# Patient Record
Sex: Male | Born: 1985 | Race: White | Hispanic: No | Marital: Married | Smoking: Never smoker
Health system: Southern US, Community
[De-identification: ages and names within clinical notes are randomized; demographics above are authoritative.]

## PROBLEM LIST (undated history)

## (undated) DIAGNOSIS — M1990 Unspecified osteoarthritis, unspecified site: Secondary | ICD-10-CM

## (undated) DIAGNOSIS — T07XXXA Unspecified multiple injuries, initial encounter: Secondary | ICD-10-CM

## (undated) HISTORY — PX: TUBAL LIGATION: SHX77

## (undated) HISTORY — PX: ANKLE FRACTURE SURGERY: SHX122

## (undated) HISTORY — DX: Unspecified osteoarthritis, unspecified site: M19.90

## (undated) HISTORY — PX: FRACTURE SURGERY: SHX138

## (undated) HISTORY — DX: Unspecified multiple injuries, initial encounter: T07.XXXA

## (undated) HISTORY — PX: HERNIA REPAIR: SHX51

---
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
## 2009-10-14 ENCOUNTER — Ambulatory Visit (HOSPITAL_COMMUNITY): Admission: RE | Admit: 2009-10-14 | Discharge: 2009-10-14 | Payer: Self-pay | Admitting: Family Medicine

## 2010-07-29 ENCOUNTER — Other Ambulatory Visit: Payer: Self-pay | Admitting: Orthopedic Surgery

## 2010-07-29 ENCOUNTER — Ambulatory Visit (HOSPITAL_COMMUNITY)
Admission: RE | Admit: 2010-07-29 | Discharge: 2010-07-29 | Disposition: A | Discharge status: Home / Self Care | Payer: 59 | Source: Ambulatory Visit | Attending: Orthopedic Surgery | Admitting: Orthopedic Surgery

## 2010-07-29 ENCOUNTER — Encounter: Payer: Self-pay | Admitting: Orthopedic Surgery

## 2010-07-29 DIAGNOSIS — S92109A Unspecified fracture of unspecified talus, initial encounter for closed fracture: Secondary | ICD-10-CM | POA: Insufficient documentation

## 2010-07-29 DIAGNOSIS — M25473 Effusion, unspecified ankle: Secondary | ICD-10-CM | POA: Insufficient documentation

## 2010-07-29 DIAGNOSIS — R52 Pain, unspecified: Secondary | ICD-10-CM

## 2010-07-29 DIAGNOSIS — R609 Edema, unspecified: Secondary | ICD-10-CM

## 2010-07-29 DIAGNOSIS — X58XXXA Exposure to other specified factors, initial encounter: Secondary | ICD-10-CM | POA: Insufficient documentation

## 2010-07-29 DIAGNOSIS — M25476 Effusion, unspecified foot: Secondary | ICD-10-CM | POA: Insufficient documentation

## 2010-07-29 DIAGNOSIS — M25579 Pain in unspecified ankle and joints of unspecified foot: Secondary | ICD-10-CM | POA: Insufficient documentation

## 2010-07-30 ENCOUNTER — Ambulatory Visit (INDEPENDENT_AMBULATORY_CARE_PROVIDER_SITE_OTHER): Payer: 59 | Admitting: Orthopedic Surgery

## 2010-07-30 ENCOUNTER — Encounter: Payer: Self-pay | Admitting: Orthopedic Surgery

## 2010-07-30 DIAGNOSIS — S96819A Strain of other specified muscles and tendons at ankle and foot level, unspecified foot, initial encounter: Secondary | ICD-10-CM | POA: Insufficient documentation

## 2010-07-30 DIAGNOSIS — S93429A Sprain of deltoid ligament of unspecified ankle, initial encounter: Secondary | ICD-10-CM | POA: Insufficient documentation

## 2010-07-30 DIAGNOSIS — S93419A Sprain of calcaneofibular ligament of unspecified ankle, initial encounter: Secondary | ICD-10-CM

## 2010-07-30 DIAGNOSIS — S93499A Sprain of other ligament of unspecified ankle, initial encounter: Secondary | ICD-10-CM

## 2010-08-05 NOTE — Letter (Signed)
Summary: Out of School Circuit + weight tr'g classes  [facility]  [address]  [place], [place] [address]   Phone: [phone]  Fax: [phone]    July 30, 2010   Student:  Paul Watson [employer]    To Whom It May Concern:   For Medical reasons, please excuse the above named student from school for the following dates:  Start:   July 30, 2010  End/Return to Education administrator / weight training - Nov 12, 2010 or until otherwise notified   If you need additional information, please feel free to contact our office.   Sincerely,    Terrance Mass, MD    ****This is a legal document and cannot be tampered with.  Schools are authorized to verify all information and to do so accordingly.

## 2010-08-05 NOTE — Letter (Signed)
Summary: Out of School/classes  [facility]  [address]  [place], [place] [address]   Phone: [phone]  Fax: [phone]    July 30, 2010   Student:  Burnis Kingfisher [employer]    To Whom It May Concern:   For Medical reasons, please excuse the above named student from school for the following dates:  Start:   July 30, 2010  End/Return to classes:  August 03, 2010 or until otherwise notified      If you need additional information, please feel free to contact our office.   Sincerely,    Terrance Mass, MD    ****This is a legal document and cannot be tampered with.  Schools are authorized to verify all information and to do so accordingly.

## 2010-08-05 NOTE — Assessment & Plan Note (Signed)
Summary: EVAL/XRAY [facility] 07/28/10 PER DR Ernan Runkles/[facility]/CAF   Vital Signs:  Patient profile:   25 year old male Height:      72 inches Weight:      183 pounds Pulse rate:   82 / minute Resp:     18 per minute  Visit Type:  new patient Referring Provider:  ap er Primary Provider:  Dr. Lubertha South  CC:  right ankle pain.  History of Present Illness: I saw Paul Watson in the office today for an initial visit.  He is a 24 years old man with the complaint of:  right ankle pain.  DOI 07/29/10.  Xrays [facility] 07/29/10.  No meds.  This 25 year old male had multiple ankle injuries and multiple angles brings her no treatment other than limited time of immobilization, ice and elevation. No therapy.  On yesterday. He was playing basketball turned his ankle again. No complaints of sharp throbbing, burning, pain, which is 7-8/10. The pain is constant and associated with throbbing. He is a large amount of bruising over the lateral and anterolateral ankle joint with pain in the syndesmosis region, medial malleolar and lateral malleolar areas. An x-ray as already been done, and seeing if he has a medial malleolar nondisplaced fracture at the tip associated with avulsion. There is a minimally displaced lateral talar osteochondral defect, and he has a lateral malleolar avulsion question old versus new    Allergies (verified): No Known Drug Allergies  Past History:  Past Medical History: na  Past Surgical History: hernia  Family History: Family History of Arthritis  Social History: college no smoking no alcohol 2 sodas per day  Review of Systems Constitutional:  Denies weight loss, weight gain, fever, chills, and fatigue. Cardiovascular:  Denies chest pain, palpitations, fainting, and murmurs. Respiratory:  Denies short of breath, wheezing, couch, tightness, pain on inspiration, and snoring . Gastrointestinal:  Denies heartburn, nausea, vomiting, diarrhea, constipation, and blood in your  stools. Genitourinary:  Denies frequency, urgency, difficulty urinating, painful urination, flank pain, and bleeding in urine. Neurologic:  Denies numbness, tingling, unsteady gait, dizziness, tremors, and seizure. Musculoskeletal:  Denies joint pain, swelling, instability, stiffness, redness, heat, and muscle pain. Endocrine:  Denies excessive thirst, exessive urination, and heat or cold intolerance. Psychiatric:  Denies nervousness, depression, anxiety, and hallucinations. Skin:  Denies changes in the skin, poor healing, rash, itching, and redness. HEENT:  Denies blurred or double vision, eye pain, redness, and watering. Immunology:  Denies seasonal allergies, sinus problems, and allergic to bee stings. Hemoatologic:  Denies easy bleeding and brusing.  Physical Exam  Skin:  RIGHT ankle/intact without lesions or rashes Psych:  alert and cooperative; normal mood and affect; normal attention span and concentration   Foot/Ankle Exam  General:    Well-developed, well-nourished ,normal body habitus; no deformities, normal grooming.    Gait:    crutches nwb right ankle   Skin:    ecchymosis over the syndesmosois   Inspection:    swelling: right ankle, medial , severe lateral, severe anterior   Palpation:    tenderness R-lateral malleoulus: and tenderness R-medial malleoulus and tib fib joint   Vascular:    dorsalis pedis and posterior tibial pulses 2+ and symmetric, capillary refill < 2 seconds, normal hair pattern, no evidence of ischemia.   Sensory:    gross sensation intact bilaterally in lower extremities.    Motor:    dorsiflexion weakness   Reflexes:    .deferred    Ankle Exam:    Right:  Inspection:  Abnormal    Palpation:  Abnormal    Stability:  ?    Tenderness:  yes    Swelling:  yes    Range of Motion:       Dorsiflex-Passive: 0       Plantar Flex-Passive: 15   Impression & Recommendations:  Problem # 1:  SPRAIN AND STRAIN OF CALCANEOFIBULAR  (ICD-845.02) Assessment New  Orders: New Patient Level III (29562)  Problem # 2:  SPRAIN AND STRAIN OF DELTOID OF ANKLE (ICD-845.01) Assessment: New  Orders: New Patient Level III (13086)  Problem # 3:  OTHER ANKLE SPRAIN AND STRAIN (ICD-845.09) Assessment: New  The x-rays were done at [facility]. The report and the films have been reviewed. medial and lateral avulsion tip fractures and talus - lateral dome fracture - small   nonweightbearing for 6 weeks.  Plan PT for 4-6 weeks, then MRI if continued trouble such as catching or locking  Orders: New Patient Level III (57846)  Medications Added to Medication List This Visit: 1)  Ibuprofen 800 Mg Tabs (Ibuprofen) .Marland Kitchen.. 1 by mouth three times a day  Patient Instructions: 1)  work note  2)  school note  3)  no weight x 6 weeks 4)  exercise the ankle evrey night 25 repititions  5)  f/u 3 weeks  6)  take ibuprofen for pain 800 mg three times a day  7)  apply ice every night for 30 minutes x 72 hrs  Prescriptions: IBUPROFEN 800 MG TABS (IBUPROFEN) 1 by mouth three times a day  #90 x 2   Entered and Authorized by:   Fuller Canada MD   Signed by:   Fuller Canada MD on 07/30/2010   Method used:   Faxed to ...       [facility]  [address] [facility] (retail)       [address]       [place], [place]  [address]       Ph: [phone]       Fax: [phone]   RxID:   [id number]    Orders Added: 1)  New Patient Level III [phone]

## 2010-08-05 NOTE — Letter (Signed)
Summary: Out of Work  [facility]  [address]  [place], [place] [address]   Phone: [phone]  Fax: [phone]    July 30, 2010   Employee:  Paul Watson [employer] [facility] [facility]    To Whom It May Concern:   For Medical reasons, please excuse the above named employee from work for the following dates:  Start:   07/30/10  Continue out of work status X6 weeks or until further notice   End (estimated)  09/12/10  If you need additional information, please feel free to contact our office.         Sincerely,    Terrance Mass, MD

## 2010-08-12 ENCOUNTER — Encounter: Payer: Self-pay | Admitting: Orthopedic Surgery

## 2010-08-25 ENCOUNTER — Encounter: Payer: Self-pay | Admitting: Orthopedic Surgery

## 2010-08-25 ENCOUNTER — Ambulatory Visit (INDEPENDENT_AMBULATORY_CARE_PROVIDER_SITE_OTHER): Payer: 59 | Admitting: Orthopedic Surgery

## 2010-08-25 DIAGNOSIS — S92109A Unspecified fracture of unspecified talus, initial encounter for closed fracture: Secondary | ICD-10-CM

## 2010-08-25 DIAGNOSIS — S93419A Sprain of calcaneofibular ligament of unspecified ankle, initial encounter: Secondary | ICD-10-CM

## 2010-08-25 DIAGNOSIS — S93429A Sprain of deltoid ligament of unspecified ankle, initial encounter: Secondary | ICD-10-CM

## 2010-09-01 NOTE — Assessment & Plan Note (Signed)
Summary: 3 WK RE-CK RT FOOT/XRAYS/FX CARE/[facility]/CAF   Visit Type:  Follow-up Referring Provider:  ap er Primary Provider:  Dr. Lubertha South  CC:  right ankle pain.  History of Present Illness: 25 year old male with multiple injuries to his RIGHT ankle presents after 3 weeks treatment and a Cam Walker nonweightbearing for an osteochondral fracture the Taylor's and recurrent ankle sprain DOI 07/29/10.  Xrays [facility] 07/29/10.  Medications: Ibuprofen 800 mg.  Complaints: none.  His pain has gone down his pain is less he has continued to be nonweightbearing      Allergies: No Known Drug Allergies  Review of Systems Musculoskeletal:  Complains of stiffness.  Physical Exam  Additional Exam:  He in with his crutches  He was nonweightbearing  He had good perfusion to his foot is cold or of the foot looked good the skin was intact sensation was normal   The drawer test was stable, the inversion test was stable, the version test was stable  The plantarflexion was approximately 15 and dorsiflexion 10       Impression & Recommendations:  Problem # 1:  SPRAIN AND STRAIN OF DELTOID OF ANKLE (ICD-845.01)  Orders: Est. Patient Level III (04540)  Problem # 2:  SPRAIN AND STRAIN OF CALCANEOFIBULAR (ICD-845.02)  Orders: Est. Patient Level III (98119)  Problem # 3:  CLOSED FRACTURE OF ASTRAGALUS (ICD-825.21) Assessment: Comment Only  Orders: Est. Patient Level III (14782)  Patient Instructions: 1)  xrays right ankle week of the 28th  2)  continue non weight bearing    Orders Added: 1)  Est. Patient Level III [95621]

## 2010-09-10 ENCOUNTER — Ambulatory Visit (INDEPENDENT_AMBULATORY_CARE_PROVIDER_SITE_OTHER): Payer: 59 | Admitting: Orthopedic Surgery

## 2010-09-10 ENCOUNTER — Encounter: Payer: Self-pay | Admitting: Orthopedic Surgery

## 2010-09-10 DIAGNOSIS — S93419A Sprain of calcaneofibular ligament of unspecified ankle, initial encounter: Secondary | ICD-10-CM

## 2010-09-10 DIAGNOSIS — S92109A Unspecified fracture of unspecified talus, initial encounter for closed fracture: Secondary | ICD-10-CM

## 2010-09-10 DIAGNOSIS — S93429A Sprain of deltoid ligament of unspecified ankle, initial encounter: Secondary | ICD-10-CM

## 2010-09-10 NOTE — Letter (Signed)
Summary: History form  History form   Imported By: Cammie Sickle 08/30/2010 13:49:34  _____________________________________________________________________  External Attachment:    Type:   Image     Comment:   External Document

## 2010-09-10 NOTE — Discharge Summary (Signed)
Separate identifiable. X-ray report 3 views, RIGHT ankle.  Reason for x-ray followup talar dome fracture.  Fracture fragment is nondisplaced, compared to previous x-rays. No changes have been noted in the position.  Impression nondisplaced posterior lateral talar dome fracture.

## 2010-09-10 NOTE — Patient Instructions (Signed)
WEIGHT BEAR AS TOLERATED   WEAR CAM WALKER X 2 WEEKS THEN WEAR ASO BRACE   START PT

## 2010-09-10 NOTE — Progress Notes (Signed)
Bob had a posterolateral talar dome lesion, and a acute on chronic ankle sprain. Presents back for x-ray. X-ray today shows that the fracture fragment remains nondisplaced.  His range of motion has improved. He is less tender less swollen.  Recommend physical therapy, ASO brace and. Follow up 6 weeks.

## 2010-09-15 ENCOUNTER — Ambulatory Visit (HOSPITAL_COMMUNITY)
Admission: RE | Admit: 2010-09-15 | Discharge: 2010-09-15 | Disposition: A | Discharge status: Home / Self Care | Payer: 59 | Source: Ambulatory Visit | Attending: Orthopedic Surgery | Admitting: Orthopedic Surgery

## 2010-09-15 DIAGNOSIS — M6281 Muscle weakness (generalized): Secondary | ICD-10-CM | POA: Insufficient documentation

## 2010-09-15 DIAGNOSIS — IMO0001 Reserved for inherently not codable concepts without codable children: Secondary | ICD-10-CM | POA: Insufficient documentation

## 2010-09-15 DIAGNOSIS — R262 Difficulty in walking, not elsewhere classified: Secondary | ICD-10-CM | POA: Insufficient documentation

## 2010-09-15 DIAGNOSIS — M25579 Pain in unspecified ankle and joints of unspecified foot: Secondary | ICD-10-CM | POA: Insufficient documentation

## 2010-09-15 DIAGNOSIS — M25673 Stiffness of unspecified ankle, not elsewhere classified: Secondary | ICD-10-CM | POA: Insufficient documentation

## 2010-09-15 DIAGNOSIS — M25676 Stiffness of unspecified foot, not elsewhere classified: Secondary | ICD-10-CM | POA: Insufficient documentation

## 2010-09-16 ENCOUNTER — Ambulatory Visit (HOSPITAL_COMMUNITY)
Admission: RE | Admit: 2010-09-16 | Discharge: 2010-09-16 | Disposition: A | Discharge status: Home / Self Care | Payer: 59 | Source: Ambulatory Visit | Admitting: Physical Therapy

## 2010-09-22 ENCOUNTER — Ambulatory Visit (HOSPITAL_COMMUNITY)
Admission: RE | Admit: 2010-09-22 | Discharge: 2010-09-22 | Disposition: A | Discharge status: Home / Self Care | Payer: 59 | Source: Ambulatory Visit | Attending: *Deleted | Admitting: *Deleted

## 2010-09-24 ENCOUNTER — Ambulatory Visit (HOSPITAL_COMMUNITY)
Admission: RE | Admit: 2010-09-24 | Discharge: 2010-09-24 | Disposition: A | Discharge status: Home / Self Care | Payer: 59 | Source: Ambulatory Visit | Attending: Family Medicine | Admitting: Family Medicine

## 2010-09-25 ENCOUNTER — Ambulatory Visit (HOSPITAL_COMMUNITY)
Admission: RE | Admit: 2010-09-25 | Discharge: 2010-09-25 | Disposition: A | Discharge status: Home / Self Care | Payer: 59 | Source: Ambulatory Visit

## 2010-09-29 ENCOUNTER — Ambulatory Visit (HOSPITAL_COMMUNITY)
Admission: RE | Admit: 2010-09-29 | Discharge: 2010-09-29 | Disposition: A | Discharge status: Home / Self Care | Payer: 59 | Source: Ambulatory Visit | Attending: Family Medicine | Admitting: Family Medicine

## 2010-10-01 ENCOUNTER — Ambulatory Visit (HOSPITAL_COMMUNITY)
Admission: RE | Admit: 2010-10-01 | Discharge: 2010-10-01 | Disposition: A | Discharge status: Home / Self Care | Payer: 59 | Source: Ambulatory Visit | Attending: Family Medicine | Admitting: Family Medicine

## 2010-10-06 ENCOUNTER — Ambulatory Visit (HOSPITAL_COMMUNITY)
Admission: RE | Admit: 2010-10-06 | Discharge: 2010-10-06 | Disposition: A | Discharge status: Home / Self Care | Payer: 59 | Source: Ambulatory Visit | Attending: Orthopedic Surgery | Admitting: Orthopedic Surgery

## 2010-10-06 DIAGNOSIS — M25579 Pain in unspecified ankle and joints of unspecified foot: Secondary | ICD-10-CM | POA: Insufficient documentation

## 2010-10-06 DIAGNOSIS — R262 Difficulty in walking, not elsewhere classified: Secondary | ICD-10-CM | POA: Insufficient documentation

## 2010-10-06 DIAGNOSIS — M6281 Muscle weakness (generalized): Secondary | ICD-10-CM | POA: Insufficient documentation

## 2010-10-06 DIAGNOSIS — IMO0001 Reserved for inherently not codable concepts without codable children: Secondary | ICD-10-CM | POA: Insufficient documentation

## 2010-10-08 ENCOUNTER — Ambulatory Visit (HOSPITAL_COMMUNITY): Payer: 59 | Admitting: Physical Therapy

## 2010-10-13 ENCOUNTER — Ambulatory Visit (HOSPITAL_COMMUNITY)
Admission: RE | Admit: 2010-10-13 | Discharge: 2010-10-13 | Disposition: A | Discharge status: Home / Self Care | Payer: 59 | Source: Ambulatory Visit | Attending: Orthopedic Surgery | Admitting: Orthopedic Surgery

## 2010-10-13 DIAGNOSIS — R262 Difficulty in walking, not elsewhere classified: Secondary | ICD-10-CM | POA: Insufficient documentation

## 2010-10-13 DIAGNOSIS — M25579 Pain in unspecified ankle and joints of unspecified foot: Secondary | ICD-10-CM | POA: Insufficient documentation

## 2010-10-13 DIAGNOSIS — M6281 Muscle weakness (generalized): Secondary | ICD-10-CM | POA: Insufficient documentation

## 2010-10-13 DIAGNOSIS — IMO0001 Reserved for inherently not codable concepts without codable children: Secondary | ICD-10-CM | POA: Insufficient documentation

## 2010-10-15 ENCOUNTER — Ambulatory Visit (HOSPITAL_COMMUNITY)
Admission: RE | Admit: 2010-10-15 | Discharge: 2010-10-15 | Disposition: A | Discharge status: Home / Self Care | Payer: 59 | Source: Ambulatory Visit | Attending: Family Medicine | Admitting: Family Medicine

## 2010-10-20 ENCOUNTER — Ambulatory Visit (HOSPITAL_COMMUNITY): Payer: 59

## 2010-10-22 ENCOUNTER — Ambulatory Visit (INDEPENDENT_AMBULATORY_CARE_PROVIDER_SITE_OTHER): Payer: 59 | Admitting: Orthopedic Surgery

## 2010-10-22 ENCOUNTER — Encounter: Payer: Self-pay | Admitting: Orthopedic Surgery

## 2010-10-22 DIAGNOSIS — M24876 Other specific joint derangements of unspecified foot, not elsewhere classified: Secondary | ICD-10-CM

## 2010-10-22 DIAGNOSIS — M25373 Other instability, unspecified ankle: Secondary | ICD-10-CM | POA: Insufficient documentation

## 2010-10-22 NOTE — Progress Notes (Signed)
Paul Watson is doing well. He is completed. Most of this therapy, visits he's met almost all of his goals. He's not taking any pain medicine anymore. He is walking without a limp. His swelling is gone down. His ankle is stable. He has good eversion power  He is released to normal activities. He should wear his brace when playing sports. He should complete his physical therapy visits. His followup is as needed

## 2010-10-22 NOTE — Patient Instructions (Signed)
Brace for sports   Ok to walk without brace   Finish PT visits

## 2010-10-23 ENCOUNTER — Ambulatory Visit (HOSPITAL_COMMUNITY)
Admission: RE | Admit: 2010-10-23 | Discharge: 2010-10-23 | Disposition: A | Discharge status: Home / Self Care | Payer: 59 | Source: Ambulatory Visit | Attending: Family Medicine | Admitting: Family Medicine

## 2010-10-26 ENCOUNTER — Telehealth: Payer: Self-pay | Admitting: *Deleted

## 2010-10-26 ENCOUNTER — Ambulatory Visit (HOSPITAL_COMMUNITY): Payer: 59 | Admitting: *Deleted

## 2010-10-26 NOTE — Telephone Encounter (Signed)
Is finishing last PT visit, wanted to let us know

## 2010-10-28 ENCOUNTER — Ambulatory Visit (HOSPITAL_COMMUNITY)
Admission: RE | Admit: 2010-10-28 | Discharge: 2010-10-28 | Disposition: A | Discharge status: Home / Self Care | Payer: 59 | Source: Ambulatory Visit | Attending: Family Medicine | Admitting: Family Medicine

## 2011-01-07 ENCOUNTER — Telehealth: Payer: Self-pay | Admitting: *Deleted

## 2011-01-07 NOTE — Telephone Encounter (Signed)
Called stating his ankle is hurting again since playing softball, please put this patient in for Tuesday thanks per Dr. Rexene Edison

## 2011-01-07 NOTE — Telephone Encounter (Signed)
Scheduled per note for Tuesday 01/12/11.

## 2011-01-12 ENCOUNTER — Ambulatory Visit (INDEPENDENT_AMBULATORY_CARE_PROVIDER_SITE_OTHER): Payer: 59 | Admitting: Orthopedic Surgery

## 2011-01-12 ENCOUNTER — Other Ambulatory Visit: Payer: Self-pay

## 2011-01-12 DIAGNOSIS — M25579 Pain in unspecified ankle and joints of unspecified foot: Secondary | ICD-10-CM

## 2011-01-12 DIAGNOSIS — M25571 Pain in right ankle and joints of right foot: Secondary | ICD-10-CM

## 2011-01-12 MED ORDER — METHYLPREDNISOLONE ACETATE 40 MG/ML IJ SUSP: 40.0000 mg | Freq: Once | INTRAMUSCULAR | Status: DC

## 2011-01-12 NOTE — Progress Notes (Signed)
Previous RIGHT ankle injury one of several chronic recurrent instability episodes and injuries to the RIGHT ankle.  When we worked him up he had an MRI and an x-ray which shows a osteochondral fragment in the superolateral dome of the tibialis.  We treated him with a cast/brace and then he went to therapy and he is in an ASO brace now.  He went back to play baseball/softball in his ankles or swelling he started having lateral ankle pain  Initial echo he had a high ankle sprain along with his chronic lower ankle sprains.  He has lateral gutter pain and pain Along the lateral border of the foot.  No weakness of the peroneals no tenderness of the peroneals.  Ankle remains stable.  He has some tenderness in the sinus tarsi.  Neurovascular exam is normal and ambulation is normal.  I injected the lateral gutter.  Repeat x-rays show a stable lateral talar dome OCD Ankle Injection Procedure Note  Pre-operative Diagnosis: right lateral ankle pain   Post-operative Diagnosis: same  Indications: ankle pain lateral gutter   Anesthesia: ethyl chloride   Procedure Details   Verbal consent was obtained for the procedure. The point of maximum tenderness was identified and marked.  After a sterile alcohol prep, the site was anesthetized.  A 25 gauge needle was advanced into the area without difficulty.  The area was then injected with 1 ml of depomedrol. The area was cleansed with isopropyl alcohol and a dressing was applied.   Complications:  None; patient tolerated the procedure well.

## 2011-01-12 NOTE — Patient Instructions (Signed)
You have received a steroid shot. 15% of patients experience increased pain at the injection site with in the next 24 hours. This is best treated with ice and tylenol extra strength 2 tabs every 8 hours. If you are still having pain please call the office.   Take ibuprofen 3 times daily for 2 weeks   Ice after sports

## 2011-02-02 ENCOUNTER — Ambulatory Visit (INDEPENDENT_AMBULATORY_CARE_PROVIDER_SITE_OTHER): Payer: 59 | Admitting: Orthopedic Surgery

## 2011-02-02 ENCOUNTER — Encounter: Payer: Self-pay | Admitting: Orthopedic Surgery

## 2011-02-02 DIAGNOSIS — S93419A Sprain of calcaneofibular ligament of unspecified ankle, initial encounter: Secondary | ICD-10-CM

## 2011-02-02 DIAGNOSIS — S92109A Unspecified fracture of unspecified talus, initial encounter for closed fracture: Secondary | ICD-10-CM

## 2011-02-02 NOTE — Patient Instructions (Signed)
Referral will be done for DR [facility] POSSIBLE ANKLE SCOPE

## 2011-02-02 NOTE — Progress Notes (Signed)
Previous RIGHT ankle injury one of several chronic recurrent instability episodes and injuries to the RIGHT ankle. When we worked him up he had an MRI and an x-ray which shows a osteochondral fragment in the superolateral dome of the talus. We treated him with a cast/brace and then he went to therapy and he is in an ASO brace now. He went back to play softball  his ankles started  Swelling; he started having lateral ankle pain  I injected the lateral gutter, and the swelling went down, but the pain has persisted.  Complains of lateral gutter, ankle pain. His drawer test seems good. He has no pain with inversion. However, he has tenderness in the lateral gutter and also in the sub-Taylor's recess.  May need ankle scope to clean up the lateral gutter.  Recommend consult for ankle scope with Dr Lestine Box.

## 2011-02-03 ENCOUNTER — Other Ambulatory Visit: Payer: Self-pay | Admitting: Orthopedic Surgery

## 2011-02-03 DIAGNOSIS — M25579 Pain in unspecified ankle and joints of unspecified foot: Secondary | ICD-10-CM

## 2011-02-17 ENCOUNTER — Telehealth: Payer: Self-pay | Admitting: Radiology

## 2011-02-17 NOTE — Telephone Encounter (Signed)
I sent a referral for this patient to Dr. Lestine Box to be seen for right ankle pain.

## 2011-02-26 ENCOUNTER — Telehealth: Payer: Self-pay | Admitting: Radiology

## 2011-02-26 NOTE — Telephone Encounter (Signed)
Patient has an appointment with Dr. Lestine Box on 03-22-11 at 2:00.

## 2011-03-26 ENCOUNTER — Other Ambulatory Visit (HOSPITAL_COMMUNITY): Payer: Self-pay | Admitting: Orthopedic Surgery

## 2011-03-26 DIAGNOSIS — M25579 Pain in unspecified ankle and joints of unspecified foot: Secondary | ICD-10-CM

## 2011-03-30 ENCOUNTER — Inpatient Hospital Stay (HOSPITAL_COMMUNITY)
Admission: RE | Admit: 2011-03-30 | Discharge: 2011-03-30 | Payer: 59 | Source: Ambulatory Visit | Attending: Orthopedic Surgery | Admitting: Orthopedic Surgery

## 2011-03-30 ENCOUNTER — Ambulatory Visit (HOSPITAL_COMMUNITY)
Admission: RE | Admit: 2011-03-30 | Discharge: 2011-03-30 | Disposition: A | Discharge status: Home / Self Care | Payer: 59 | Source: Ambulatory Visit | Attending: Orthopedic Surgery | Admitting: Orthopedic Surgery

## 2011-03-30 DIAGNOSIS — M25579 Pain in unspecified ankle and joints of unspecified foot: Secondary | ICD-10-CM | POA: Insufficient documentation

## 2011-03-30 DIAGNOSIS — R937 Abnormal findings on diagnostic imaging of other parts of musculoskeletal system: Secondary | ICD-10-CM | POA: Insufficient documentation

## 2011-09-23 ENCOUNTER — Ambulatory Visit (HOSPITAL_COMMUNITY): Payer: 59 | Admitting: Physical Therapy

## 2011-10-01 ENCOUNTER — Ambulatory Visit (HOSPITAL_COMMUNITY)
Admission: RE | Admit: 2011-10-01 | Discharge: 2011-10-01 | Disposition: A | Discharge status: Home / Self Care | Payer: 59 | Source: Ambulatory Visit | Attending: Orthopedic Surgery | Admitting: Orthopedic Surgery

## 2011-10-01 DIAGNOSIS — M25476 Effusion, unspecified foot: Secondary | ICD-10-CM | POA: Insufficient documentation

## 2011-10-01 DIAGNOSIS — M25473 Effusion, unspecified ankle: Secondary | ICD-10-CM | POA: Insufficient documentation

## 2011-10-01 DIAGNOSIS — M25579 Pain in unspecified ankle and joints of unspecified foot: Secondary | ICD-10-CM | POA: Insufficient documentation

## 2011-10-01 DIAGNOSIS — IMO0001 Reserved for inherently not codable concepts without codable children: Secondary | ICD-10-CM | POA: Insufficient documentation

## 2011-10-01 DIAGNOSIS — M6281 Muscle weakness (generalized): Secondary | ICD-10-CM | POA: Insufficient documentation

## 2011-10-01 NOTE — Evaluation (Signed)
Physical Therapy Evaluation  Patient Details  Name: Paul Watson MRN: [id number] Date of Birth: 11/23/1985  Today's Date: 10/01/2011 Time: 1415-1500 Time Calculation (min): 45 min  Visit#: 1  of 8   Re-eval: 10/31/11 Assessment Diagnosis: s/p arthroscopic  Surgical Date: 06/01/11 Next MD Visit:  (11/18/11) Prior Therapy: prior to surgery  Past Medical History: No past medical history on file. Past Surgical History: No past surgical history on file.  Subjective Symptoms/Limitations Symptoms: Mr. Mayhall states that he injured his ankle last year.  He underwent therapy and went back to playing ball when his ankle began to swell and have pain again.  He went back to the ortho who referred him to Dr. Lestine Box.  The pt underwent arthroscopic with debridemtnt and a biocartilage allograft.  The pt is now being referred for manual mobs, active and passive exercises and modalities as needed .  He is to wean out of his brace and begin proprioception and strengthening as able.   How long can you sit comfortably?: no difficulty  How long can you stand comfortably?: The patient is wearing his brace.  With his brace he is able to stand as long as he wants. How long can you walk comfortably?: With his brace he is able to walk as long as he wants to Pain Assessment Currently in Pain?: No/denies  Precautions/Restrictions   none  Prior Functioning  Prior Function Able to Take Stairs?: Reciprically Vocation: Full time employment Vocation Requirements: sitting Leisure: Hobbies-yes (Comment) Comments: softball, basketball,lift weights.    Sensation/Coordination/Flexibility/Functional Tests Functional Tests Functional Tests: Comprehensive LOS overal 6/100  Assessment RLE AROM (degrees) Right Ankle Dorsiflexion 0-20:  (wfl) Right Ankle Plantar Flexion 0-45:  (wfl) Right Ankle Inversion:  (wfl) Right Ankle Eversion:  (wfl) RLE Strength Right Ankle Dorsiflexion: 5/5 Right Ankle Plantar  Flexion: 5/5 Right Ankle Inversion: 5/5 Right Ankle Eversion: 5/5  Exercise/Treatments Mobility/Balance  Static Standing Balance Single Leg Stance - Right Leg:  (eyes closed 7 seconds on R) Single Leg Stance - Left Leg:  (1 min)    Ankle Exercises - Standing BAPS: Standing;Level 4 Vector Stance: 3 reps SLS:  (eyes closed x 10 seconds.) Balance Master: Limits for Stability: test- definitely needs Balance Master: Static: test-no longer needed  Heel Raises: 10 reps;Limitations Heel Raises Limitations: R only Other Standing Ankle Exercises: on inverted bosu touch toes and up.    Physical Therapy Assessment and Plan PT Assessment and Plan Clinical Impression Statement: Pt with decreased higher level proprioception.  Pt will need improved proprioception to return to leisure sports activity. Rehab Potential: Good PT Frequency: Min 2X/week PT Duration: 4 weeks PT Treatment/Interventions: Neuromuscular re-education;Balance training PT Plan: complete balance master for dynamic limits of stability multiple times for exercise.  Complete rebounder on foam; walk balance beam and squat down; on inverted bosu touch floor, Warrior I next treatment as well as any other therapist would like to increase dynamic proprioception.    Goals Home Exercise Program Pt will Perform Home Exercise Program: Independently PT Short Term Goals Time to Complete Short Term Goals: 2 weeks PT Short Term Goal 1: Pt to be ambulating without a brace inside and outside PT Long Term Goals Time to Complete Long Term Goals: 4 weeks PT Long Term Goal 1: Pt to be able to jog without any pain or swelling.  Problem List Patient Active Problem List  Diagnoses  . SPRAIN AND STRAIN OF DELTOID OF ANKLE  . SPRAIN AND STRAIN OF CALCANEOFIBULAR  . OTHER ANKLE  SPRAIN AND STRAIN  . CLOSED FRACTURE OF ASTRAGALUS  . Ankle instability    PT - End of Session Activity Tolerance: Patient tolerated treatment  well General Cognition: WFL for tasks performed PT Plan of Care Consulted and Agree with Plan of Care: Patient    Emrah Ariola,CINDY 10/01/2011, 3:11 PM  Physician Documentation Your signature is required to indicate approval of the treatment plan as stated above.  Please sign and either send electronically or make a copy of this report for your files and return this physician signed original.   Please mark one 1.__approve of plan  2. ___approve of plan with the following conditions.   ______________________________                                                          _____________________ Physician Signature                                                                                                             Date

## 2011-10-05 ENCOUNTER — Ambulatory Visit (HOSPITAL_COMMUNITY)
Admission: RE | Admit: 2011-10-05 | Discharge: 2011-10-05 | Disposition: A | Discharge status: Home / Self Care | Payer: 59 | Source: Ambulatory Visit | Attending: Family Medicine | Admitting: Family Medicine

## 2011-10-05 NOTE — Progress Notes (Addendum)
Physical Therapy Treatment Patient Details  Name: Paul Watson MRN: [id number] Date of Birth: 14-Jun-1986  Today's Date: 10/05/2011 Time: 1478-2956 Time Calculation (min): 40 min Visit#: 2  of 8   Re-eval: 10/31/11 Charges: NMR x 30' Therex x 8'  Subjective: Symptoms/Limitations Symptoms: PT reports HEP comliance. Pain Assessment Currently in Pain?: No/denies   Exercise/Treatments Ankle Stretches Slant Board Stretch: 3 reps;30 seconds Machines for Strengthening Cybex Leg Press: 4PL 2x10 Ankle Exercises - Standing BAPS: Level 4;10 reps;Standing Vector Stance: 5 reps;5 seconds;Right;Limitations Vector Stance Limitations: on foam SLS: 2x10" with eyes closed Rebounder: red ball SLS on foam x 10 Balance Master: Limits for Stability: R SLS 1:08 Balance Master: Static: 2x1' Heel Raises: 15 reps Heel Raises Limitations: R only Warrior I: 2x30" Other Standing Ankle Exercises: on inverted bosu touch toes and up.  Physical Therapy Assessment and Plan PT Assessment and Plan Clinical Impression Statement: Pt completes all exercises well with good stability. Pt is without complaint throughout session. Pt displays increased ease with balance master. Pt reports 0/10 pai nat end fo session. PT Plan: Continue to progress per PT POC. Decrease stability with balance master next session.     Problem List Patient Active Problem List  Diagnoses  . SPRAIN AND STRAIN OF DELTOID OF ANKLE  . SPRAIN AND STRAIN OF CALCANEOFIBULAR  . OTHER ANKLE SPRAIN AND STRAIN  . CLOSED FRACTURE OF ASTRAGALUS  . Ankle instability    PT - End of Session Activity Tolerance: Patient tolerated treatment well General Behavior During Session: [facility] for tasks performed Cognition: [facility] for tasks performed   Seth Bake, PTA 10/05/2011, 4:47 PM

## 2011-10-07 ENCOUNTER — Ambulatory Visit (HOSPITAL_COMMUNITY)
Admission: RE | Admit: 2011-10-07 | Discharge: 2011-10-07 | Disposition: A | Discharge status: Home / Self Care | Payer: 59 | Source: Ambulatory Visit | Attending: Family Medicine | Admitting: Family Medicine

## 2011-10-07 NOTE — Progress Notes (Signed)
Physical Therapy Treatment Patient Details  Name: Paul Watson MRN: [id number] Date of Birth: [birth year]  Today's Date: 10/07/2011 Time: 1515-1600 Time Calculation (min): 45 min Visit#: 3  of 8   Re-eval: 10/31/11  Charge: therex 45  Subjective: Symptoms/Limitations Symptoms: R ankle sore and tender, been walking the trail no real pain. Pain Assessment Currently in Pain?: No/denies Session started by Nicholes Rough, PTT   Objective:  Exercise/Treatments Ankle Stretches Slant Board Stretch: 3 reps;30 seconds Machines for Strengthening Cybex Leg Press: 4PL 2x10 Ankle Exercises - Standing BAPS: Level 4;10 reps;Standing Vector Stance: Limitations Vector Stance Limitations: 10x 10" on foam Rebounder: red ball SLS on foam x 20 Balance Master: Limits for Stability: R SLS Moderate difficulty L 6 1:07 Balance Master: Static: 2x1' L6; L5 Heel Raises: 20 reps Heel Raises Limitations: R only Warrior I: 2x30Catering manager II: 2x 30" Warrior III: 2x 15" Other Standing Ankle Exercises: on inverted bosu 20 squats with orange weight ball  Physical Therapy Assessment and Plan PT Assessment and Plan Clinical Impression Statement: Good ankle control noted with decreased level on balance master this session.  Added warrior pose III with fatigue noted.  Pt completed all therex correclty with no compliants of pain. PT Plan: Continue progressing PT POC, increase time on warrior III and begin tree pose next session.    Goals    Problem List Patient Active Problem List  Diagnoses  . SPRAIN AND STRAIN OF DELTOID OF ANKLE  . SPRAIN AND STRAIN OF CALCANEOFIBULAR  . OTHER ANKLE SPRAIN AND STRAIN  . CLOSED FRACTURE OF ASTRAGALUS  . Ankle instability    PT - End of Session Activity Tolerance: Patient tolerated treatment well General Behavior During Session: [facility] for tasks performed Cognition: [facility] for tasks performed  GP No functional reporting required 10/07/2011, 5:17 PM

## 2011-10-12 ENCOUNTER — Ambulatory Visit (HOSPITAL_COMMUNITY)
Admission: RE | Admit: 2011-10-12 | Discharge: 2011-10-12 | Disposition: A | Discharge status: Home / Self Care | Payer: 59 | Source: Ambulatory Visit | Attending: Orthopedic Surgery | Admitting: Orthopedic Surgery

## 2011-10-12 NOTE — Progress Notes (Signed)
Physical Therapy Treatment Patient Details  Name: Paul Watson MRN: [id number] Date of Birth: [birth year]  Today's Date: 10/12/2011 Time: 9629-5284 PT Time Calculation (min): 46 min  Visit#: 4  of 8   Re-eval: 10/31/11   Charge: therex 38 min  Subjective: Symptoms/Limitations Symptoms: Pt stated no pain at entrance.   Objective:   Exercise/Treatments Aerobic Exercises Elliptical: 8' @ 1.0  Machines for Strengthening Cybex Leg Press: 4.5 Pl 2x 15 Ankle Exercises - Standing BAPS: Level 4;10 reps;Standing;Limitations BAPS Limitations: 1 finger touching inside // bare Vector Stance: Limitations Vector Stance Limitations: 10x 10" on foam Balance Master: Limits for Stability: R SLS Moderate difficulty L 4 1: 01 Balance Master: Static: 2x1' L4 Heel Raises: Limitations Heel Raises Limitations: heel/toe walk 1 RT Warrior II: 2x 1' each foot forward Warrior III: 2x 30" each Other Standing Ankle Exercises: on inverted bosu 20 squats with orange weight ball Other Standing Ankle Exercises: Tree pose 1x 30" static surface, 1x 30" on foam    Physical Therapy Assessment and Plan PT Assessment and Plan Clinical Impression Statement: Good ankle strategy noted with new tree pose activity, able to increase difficulty by changing to dynamic surface with 1 LOB, pt able to regain balance requiring no assistance PT Plan: Begin plyometrics pain free next session, avoid pivots.    Goals Home Exercise Program PT Goal: Perform Home Exercise Program - Progress: Met PT Short Term Goals PT Short Term Goal 1 - Progress: Met  Problem List Patient Active Problem List  Diagnoses  . SPRAIN AND STRAIN OF DELTOID OF ANKLE  . SPRAIN AND STRAIN OF CALCANEOFIBULAR  . OTHER ANKLE SPRAIN AND STRAIN  . CLOSED FRACTURE OF ASTRAGALUS  . Ankle instability    PT - End of Session Activity Tolerance: Patient tolerated treatment well General Behavior During Session: [facility] for tasks performed Cognition:  [facility] for tasks performed  GP No functional reporting required  Juel Burrow, PTA] 10/12/2011, 5:32 PM

## 2011-10-14 ENCOUNTER — Ambulatory Visit (HOSPITAL_COMMUNITY)
Admission: RE | Admit: 2011-10-14 | Discharge: 2011-10-14 | Disposition: A | Discharge status: Home / Self Care | Payer: 59 | Source: Ambulatory Visit | Attending: Family Medicine | Admitting: Family Medicine

## 2011-10-14 DIAGNOSIS — M6281 Muscle weakness (generalized): Secondary | ICD-10-CM | POA: Insufficient documentation

## 2011-10-14 DIAGNOSIS — M25476 Effusion, unspecified foot: Secondary | ICD-10-CM | POA: Insufficient documentation

## 2011-10-14 DIAGNOSIS — M25579 Pain in unspecified ankle and joints of unspecified foot: Secondary | ICD-10-CM | POA: Insufficient documentation

## 2011-10-14 DIAGNOSIS — IMO0001 Reserved for inherently not codable concepts without codable children: Secondary | ICD-10-CM | POA: Insufficient documentation

## 2011-10-14 DIAGNOSIS — M25473 Effusion, unspecified ankle: Secondary | ICD-10-CM | POA: Insufficient documentation

## 2011-10-14 NOTE — Progress Notes (Signed)
Physical Therapy Treatment Patient Details  Name: Paul Watson MRN: [id number] Date of Birth: Oct 17, 1985  Today's Date: 10/14/2011 Time: 4098-1191 PT Time Calculation (min): 48 min Visit#: 5  of 8   Re-eval: 10/31/11 Charges: Therex x 40'   Subjective: Symptoms/Limitations Symptoms: Pt states that he is pain free and was not sore after last session. Pain Assessment Currently in Pain?: No/denies   Exercise/Treatments Ankle Plyometrics Bilateral Jumping: 10 reps;Limitations Bilateral Jumping Limitations: in place, R/L, A/P Box Circuit: 5 reps;Box Height: 4" Plyometric Exercises: agility ladder: all complete BLE; 2RT each; each square hop, every other square, high knee, lateral high knee,side tapping Ankle Exercises - Standing Vector Stance: 5 reps;10 seconds;Limitations Vector Stance Limitations: on bosu with intermittent HHA Braiding (Round Trip): 3RT (1RT slow; 1RT fast; 1RT squatting) Other Standing Ankle Exercises: single leg squat on bosu x 10 Other Standing Ankle Exercises: proprioceptive bouncing x 2' on bosu; Sports chord x 5 each direction  Physical Therapy Assessment and Plan PT Assessment and Plan Clinical Impression Statement: Began plyometrics per POC goals with some ankle instability noted.  Demonstration/vc-ing for proper landing mechanics complete. Began jogging on TM without difficulty or increased pain. Pt spoke with MD who states that he may D/C to HEP. PT Plan: Recommend D/C to HEP to PT per MD.    Goals Home Exercise Program Pt will Perform Home Exercise Program: Independently PT Short Term Goals Time to Complete Short Term Goals: 2 weeks PT Short Term Goal 1: Pt to be ambulating without a brace inside and outside PT Short Term Goal 1 - Progress: Met PT Long Term Goals Time to Complete Long Term Goals: 4 weeks PT Long Term Goal 1: Pt to be able to jog without any pain or swelling.  Problem List Patient Active Problem List  Diagnoses  . SPRAIN AND  STRAIN OF DELTOID OF ANKLE  . SPRAIN AND STRAIN OF CALCANEOFIBULAR  . OTHER ANKLE SPRAIN AND STRAIN  . CLOSED FRACTURE OF ASTRAGALUS  . Ankle instability    PT - End of Session Activity Tolerance: Patient tolerated treatment well General Behavior During Session: [facility] for tasks performed Cognition: [facility] for tasks performed   Seth Bake, PTA 10/14/2011, 5:06 PM

## 2011-10-15 ENCOUNTER — Telehealth (HOSPITAL_COMMUNITY): Payer: Self-pay | Admitting: Physical Therapy

## 2011-10-19 ENCOUNTER — Ambulatory Visit (HOSPITAL_COMMUNITY): Payer: 59

## 2011-10-21 ENCOUNTER — Ambulatory Visit (HOSPITAL_COMMUNITY): Payer: 59 | Admitting: *Deleted

## 2011-10-26 ENCOUNTER — Ambulatory Visit (HOSPITAL_COMMUNITY): Payer: 59

## 2011-10-28 ENCOUNTER — Ambulatory Visit (HOSPITAL_COMMUNITY): Payer: 59 | Admitting: Physical Therapy

## 2011-11-02 ENCOUNTER — Ambulatory Visit (HOSPITAL_COMMUNITY): Payer: 59 | Admitting: Physical Therapy

## 2011-11-04 ENCOUNTER — Ambulatory Visit (HOSPITAL_COMMUNITY): Payer: 59 | Admitting: Physical Therapy

## 2011-11-09 ENCOUNTER — Ambulatory Visit (HOSPITAL_COMMUNITY): Payer: 59 | Admitting: Physical Therapy

## 2011-11-11 ENCOUNTER — Ambulatory Visit (HOSPITAL_COMMUNITY): Payer: 59 | Admitting: *Deleted

## 2012-09-26 ENCOUNTER — Telehealth: Payer: Self-pay | Admitting: Family Medicine

## 2012-09-26 NOTE — Telephone Encounter (Signed)
O and ink wis

## 2012-09-26 NOTE — Telephone Encounter (Signed)
Note written and patient has been notified

## 2012-09-26 NOTE — Telephone Encounter (Signed)
Missed a class last week on Thursday due to upset stomach.  Professor is asking for a Doctor's note to be able to make up the lab.  Patient states he did not feel the need to make a doctors appointment he felt as if it was just a stomach bug.  Please call patient if we are able to write a note for this class.

## 2012-10-25 ENCOUNTER — Ambulatory Visit (INDEPENDENT_AMBULATORY_CARE_PROVIDER_SITE_OTHER): Payer: 59 | Admitting: Family Medicine

## 2012-10-25 ENCOUNTER — Encounter: Payer: Self-pay | Admitting: Family Medicine

## 2012-10-25 VITALS — BP 120/80 | Temp 97.8°F | Ht 72.0 in | Wt 191.0 lb

## 2012-10-25 DIAGNOSIS — N399 Disorder of urinary system, unspecified: Secondary | ICD-10-CM

## 2012-10-25 DIAGNOSIS — N410 Acute prostatitis: Secondary | ICD-10-CM

## 2012-10-25 DIAGNOSIS — R3989 Other symptoms and signs involving the genitourinary system: Secondary | ICD-10-CM

## 2012-10-25 LAB — POCT URINALYSIS DIPSTICK: pH, UA: 5

## 2012-10-25 MED ORDER — CIPROFLOXACIN HCL 750 MG PO TABS: 750.0000 mg | ORAL_TABLET | Freq: Two times a day (BID) | ORAL | Status: AC

## 2012-10-25 NOTE — Progress Notes (Signed)
  Subjective:    Patient ID: Paul Watson, male    DOB: March 30, 1986, 27 y.o.   MRN: [id number]  Urinary Tract Infection  This is a recurrent problem. The current episode started in the past 7 days. The problem occurs every urination. The problem has been gradually worsening. The quality of the pain is described as burning. The pain is at a severity of 5/10. The pain is moderate. There has been no fever. The fever has been present for 3 - 4 days. He is not sexually active. There is no history of pyelonephritis. Associated symptoms include chills, a discharge and hesitancy. He has tried NSAIDs for the symptoms. The treatment provided mild relief. prostatitis   o   Review of Systems  Constitutional: Positive for chills.  Genitourinary: Positive for hesitancy.   No true fever, no rash, no vomiting no diarrhea. ROS otherwise negative.    Objective:   Physical Exam Alert mild malaise. Lungs clear no CVA tenderness. Heart regular in rhythm. Abdomen benign. Prostate distinctly boggy and tender. Urinalysis positive for 2-4 whites per high-powered field.       Assessment & Plan:  Impression acute prostatitis discussed at length. Plan Cipro 750 twice a day for 21 days. Local measures discussed. Questions answered. WSL

## 2012-10-25 NOTE — Patient Instructions (Signed)
Finish all the antibiotics.Prostatitis The prostate gland is about the size and shape of a walnut. It is located just below your bladder. It produces one of the components of semen, which is made up of sperm and the fluids that help nourish and transport it out from the testicles. Prostatitis is redness, soreness, and swelling (inflammation) of the prostate gland.  There are 3 types of prostatitis:  Acute bacterial prostatitis This is the least common type of prostatitis. It starts quickly and usually leads to a bladder infection. It can occur at any age.  Chronic bacterial prostatitis This is a persistent bacterial infection in the prostate.It usually develops from repeated acute bacterial prostatitis or acute bacterial prostatitis that was not properly treated. It can occur in men of any age but is most common in middle-aged men whose prostate has begun to enlarge.  Chronic prostatitis chronic pelvic pain syndrome This is the most common type of prostatitis. It is inflammation of the prostate gland that is not caused by a bacterial infection. The cause is unknown. CAUSES The cause of acute and chronic bacterial prostatitis is a bacterial infection. The exact cause of chronic prostatitis and chronic pelvic pain syndrome and asymptomatic inflammatory prostatitis is unknown.  SYMPTOMS  Symptoms can vary depending upon the type of prostatitis that exists. There can also be overlap in symptoms. Possible symptoms for each type of prostatitis are listed below. Acute bacterial prostatitis  Painful urination.  Fever or chills.  Muscle or joint pains.  Low back pain.  Low abdominal pain.  Inability to empty bladder completely.  Sudden urge to urinate.  Frequent urination.  Difficulty starting urine stream.  Weak urine stream.  Discharge from the urethra.  Dribbling after urination.  Rectal pain.  Pain in the testicles, penis, or tip of the penis.  Pain in the space between the anus  and scrotum (perineum).  Problems with sexual function.  Painful ejaculation.  Bloody semen. Chronic bacterial prostatitis  The symptoms are similar to those of acute bacterial prostatitis, but they usually are much less severe. Fever, chills, and muscle and joint pain are not associated with chronic bacterial prostatitis. Chronic prostatitis chronic pelvic pain syndrome  Symptoms typically include a dull ache in the scrotum and the perineum. DIAGNOSIS  In order to diagnose prostatitis, your caregiver will ask about your symptoms. If acute or chronic bacterial prostatitis is suspected, a urine sample will be taken and tested (urinalysis). This is to see if there is bacteria in your urine. If the urinalysis result is negative for bacteria, your caregiver may use a finger to feel your prostate (digital rectal exam). This exam helps your caregiver determine if your prostate is swollen and tender. TREATMENT  Treatment for prostatitis depends on the cause. If a bacterial infection is the cause, it can be treated with antibiotic medicine. In cases of chronic bacterial prostatitis, the use of antibiotics for up to 1 month may be necessary. Your caregiver may instruct you to take sitz baths to help relieve pain. A sitz bath is a bath of hot water in which your hips and buttocks are under water. HOME CARE INSTRUCTIONS   Take all medicines as directed by your caregiver.  Take sitz baths as directed by your caregiver. SEEK MEDICAL CARE IF:   Your symptoms get worse, not better.  You have a fever. SEEK IMMEDIATE MEDICAL CARE IF:   You have chills.  You feel nauseous or vomit.  You feel lightheaded or faint.  You are unable  to urinate.  You have blood or blood clots in your urine. Document Released: 05/28/2000 Document Revised: 08/23/2011 Document Reviewed: 05/03/2011 [facility] Patient Information 2013 [place], [place].

## 2013-06-20 ENCOUNTER — Encounter: Payer: Self-pay | Admitting: Family Medicine

## 2013-06-20 ENCOUNTER — Ambulatory Visit (INDEPENDENT_AMBULATORY_CARE_PROVIDER_SITE_OTHER): Payer: 59 | Admitting: Family Medicine

## 2013-06-20 VITALS — BP 130/82 | Temp 98.0°F | Ht 72.0 in | Wt 200.1 lb

## 2013-06-20 DIAGNOSIS — R21 Rash and other nonspecific skin eruption: Secondary | ICD-10-CM

## 2013-06-20 MED ORDER — TRIAMCINOLONE ACETONIDE 0.1 % EX CREA: 1.0000 "application " | TOPICAL_CREAM | Freq: Two times a day (BID) | CUTANEOUS | Status: DC

## 2013-06-20 MED ORDER — PREDNISONE 20 MG PO TABS: ORAL_TABLET | ORAL | Status: AC

## 2013-06-20 NOTE — Progress Notes (Signed)
   Subjective:    Patient ID: Paul Watson, male    DOB: Aug 18, 1985, 28 y.o.   MRN: [id number]  Rash This is a new problem. The current episode started in the past 7 days. The problem is unchanged. The affected locations include the left arm and lips. The rash is characterized by redness and itchiness. He was exposed to nothing. Past treatments include antihistamine. The treatment provided no relief.    Rash started  Very red, itchy  No known etio,ogy,   no other exposures A lot itchy a few d ago,  Took benadryl, didn't help much Also irritation around left side. No change in vision.  Review of Systems  Skin: Positive for rash.   No headache no chest pain no cough no shortness of breath ROS otherwise negative    Objective:   Physical Exam  Alert hydration good. HEENT left periocular erythema. Neck supple lungs clear. Heart rare rhythm. Left arm patchy erythematous rash with hypertrophic regions and linear component.      Assessment & Plan:  Impression contact dermatitis discussed plan triamcinolone cream twice a day to affected area. Prednisone taper. After discussion. 15 minutes spent most in discussion. WSL

## 2013-12-06 ENCOUNTER — Ambulatory Visit (INDEPENDENT_AMBULATORY_CARE_PROVIDER_SITE_OTHER): Payer: 59 | Admitting: Family Medicine

## 2013-12-06 ENCOUNTER — Encounter: Payer: Self-pay | Admitting: Family Medicine

## 2013-12-06 VITALS — BP 138/88 | Ht 72.0 in | Wt 195.0 lb

## 2013-12-06 DIAGNOSIS — M67919 Unspecified disorder of synovium and tendon, unspecified shoulder: Secondary | ICD-10-CM

## 2013-12-06 DIAGNOSIS — M7581 Other shoulder lesions, right shoulder: Secondary | ICD-10-CM

## 2013-12-06 DIAGNOSIS — M719 Bursopathy, unspecified: Secondary | ICD-10-CM

## 2013-12-06 NOTE — Progress Notes (Signed)
   Subjective:    Patient ID: Paul Watson, male    DOB: 11/04/1985, 28 y.o.   MRN: [id number]  Shoulder Pain  The pain is present in the right shoulder. This is a chronic problem. The current episode started more than 1 year ago. The symptoms are aggravated by activity. He has tried NSAIDS for the symptoms. The treatment provided no relief.   Patient thinks he hurt shoulder playing baseball about 1 year ago.    Review of Systems    see above denies weakness or numbness in the arm but has a difficult time throwing a softball. Objective:   Physical Exam Right shoulder there is mild tenderness pain discomfort with certain movements. Neck no masses. Lungs clear heart regular. Reflexes good.  Plays a lot of softball. 15 minutes spent with patient today     Assessment & Plan:  Possible right rotator cuff injury with impingement referral to specialist will probably need injections MRI and physical therapy.  Referral to specialist was made patient was notified of the appointment.

## 2013-12-13 ENCOUNTER — Ambulatory Visit (HOSPITAL_COMMUNITY)
Admission: RE | Admit: 2013-12-13 | Discharge: 2013-12-13 | Disposition: A | Discharge status: Home / Self Care | Payer: 59 | Source: Ambulatory Visit | Attending: Orthopedic Surgery | Admitting: Orthopedic Surgery

## 2013-12-13 DIAGNOSIS — IMO0001 Reserved for inherently not codable concepts without codable children: Secondary | ICD-10-CM | POA: Insufficient documentation

## 2013-12-13 DIAGNOSIS — M6281 Muscle weakness (generalized): Secondary | ICD-10-CM | POA: Insufficient documentation

## 2013-12-13 DIAGNOSIS — M629 Disorder of muscle, unspecified: Secondary | ICD-10-CM | POA: Insufficient documentation

## 2013-12-13 DIAGNOSIS — M25619 Stiffness of unspecified shoulder, not elsewhere classified: Secondary | ICD-10-CM | POA: Insufficient documentation

## 2013-12-13 DIAGNOSIS — M25511 Pain in right shoulder: Secondary | ICD-10-CM

## 2013-12-13 DIAGNOSIS — M6289 Other specified disorders of muscle: Secondary | ICD-10-CM | POA: Insufficient documentation

## 2013-12-13 DIAGNOSIS — M25519 Pain in unspecified shoulder: Secondary | ICD-10-CM | POA: Insufficient documentation

## 2013-12-13 NOTE — Evaluation (Signed)
Occupational Therapy Evaluation  Patient Details  Name: Paul Watson MRN: [id number] Date of Birth: [birth year]  Today's Date: 12/13/2013 Time: 3790-2409 OT Time Calculation (min): 28 min Eval 28'  Visit#: 1 of 4  Re-eval: 01/10/14  Assessment Diagnosis: Right Shoulder Pain Surgical Date:  (n/a) Next MD Visit: July 28th Prior Therapy: for ankle - 2 years ago  Authorization: [employer]  Authorization Time Period: If [employer] network - 24 visits max per year  Authorization Visit#:   of     Past Medical History: No past medical history on file. Past Surgical History: No past surgical history on file.  Subjective Symptoms/Limitations Symptoms: "It started getting aggrevated again when the softball season started up." Pertinent History: Pt is presenting with right shoulder pain (tendonitis vs labral tear).  Pain began in August 2014 while playing softball (throwing outfield to home) when pt hear a pop.  Pain subsided over winter, but approx 2 weeks ago began again with the start of softball season for 2015.  Pt reports that an x-ray was clear, and no MRI has been done. Special Tests: FOTO 69/100 (31% limited) Patient Stated Goals: Get back to doing what I was able to do Pain Assessment Currently in Pain?: No/denies (Harder throws increase pain - up to 7/10.)  Precautions/Restrictions  Precautions Precautions: None Restrictions Weight Bearing Restrictions: No  Balance Screening Balance Screen Has the patient fallen in the past 6 months: No  Prior [place] expects to be discharged to:: Private residence Living Arrangements: Spouse/significant other Available Help at Discharge: Family Prior Function Level of Independence: Independent with homemaking with ambulation;Independent with basic ADLs  Assessment ADL/Vision/Perception ADL ADL Comments: Pt has increased pain with throwing an dputting significant weight onto RUE Dominant  Hand: Right Vision - History Baseline Vision: No visual deficits  Cognition/Observation Cognition Overall Cognitive Status: Within Functional Limits for tasks assessed Arousal/Alertness: Awake/alert Orientation Level: Oriented X4  Sensation/Coordination/Edema Sensation Light Touch: Appears Intact Coordination Gross Motor Movements are Fluid and Coordinated: Yes Fine Motor Movements are Fluid and Coordinated: Yes  Additional Assessments RUE Assessment RUE Assessment: Exceptions to [facility] RUE AROM (degrees) RUE Overall AROM Comments: Assessed in sitting Right Shoulder Flexion: 171 Degrees Right Shoulder ABduction: 159 Degrees Right Shoulder Internal Rotation: 95 Degrees (ahoulder adducted) Right Shoulder External Rotation: 65 Degrees (shoulder adducted, and 99 shoulder abducted) RUE PROM (degrees) RUE Overall PROM Comments: Assess in supine Right Shoulder Flexion: 170 Degrees Right Shoulder ABduction: 179 Degrees Right Shoulder Internal Rotation: 94 Degrees (shoulder adducted) Right Shoulder External Rotation: 78 Degrees (shoulder adducted, and 100 shoulder abducted) RUE Strength RUE Overall Strength Comments: Assessed in sitting Right Shoulder Flexion:  (4+/5) Right Shoulder ABduction: 5/5 Right Shoulder Internal Rotation:  (4+/5) Right Shoulder External Rotation:  (4+/5) Palpation Palpation: Moderate fascial restrictions in bicep and scapular regions,  Min fascial restrictions in upper arm      Occupational Therapy Assessment and Plan OT Assessment and Plan Clinical Impression Statement: pt is presenting to outpatient OT with right shoulder pain (secondary to tendonitis vs labral tear).  Pt is currenlty expereincing increased fascial restrictions, increased pain with rapid movements, and some decreased strength. Pt will benefit from skilled therapeutic intervention in order to improve on the following deficits: Increased fascial restricitons;Impaired UE functional  use;Pain Rehab Potential: Excellent OT Frequency: Min 1X/week OT Duration: 4 weeks OT Treatment/Interventions: Therapeutic activities;Therapeutic exercise;Patient/family education;Manual therapy OT Plan: P: Pt will benefit from skilled OT services to decrease fascial restrictions, decrease pain, and  improve strength and stability in RUE.    Treatment Plan: Myofascial release and manual stretching, PROM, AROM, general strengthening, theraband, scapular strengthening, proximal shoulder stabilization, HEP   Goals Home Exercise Program Pt/caregiver will Perform Home Exercise Program: For increased strengthening PT Goal: Perform Home Exercise Program - Progress: Goal set today Long Term Goals Time to Complete Long Term Goals: 4 weeks Long Term Goal 1: Pt will be educated on HEP Long Term Goal 2: Pt will decrease fascial restrictions to minimal or less in RUE for decreaesed pain with rapid AROM. Long Term Goal 3: Pt will improve strength in RUE to 5/5 for improved tolerance of weightbearing while playing with the dog. Long Term Goal 4: Pt will verbalize pain with throwing long distances of less than 2/10 in RUE.  Problem List Patient Active Problem List   Diagnosis Date Noted  . Pain in joint, shoulder region 12/13/2013  . Tight fascia 12/13/2013  . Muscle weakness (generalized) 12/13/2013  . 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 CLOSED FRACTURE OF ASTRAGALUS 08/25/2010  . SPRAIN AND STRAIN OF DELTOID OF ANKLE 07/30/2010  . SPRAIN AND STRAIN OF CALCANEOFIBULAR 07/30/2010  . OTHER ANKLE SPRAIN AND STRAIN 07/30/2010    End of Session Activity Tolerance: Patient tolerated treatment well General Behavior During Therapy: [facility] for tasks assessed/performed OT Plan of Care OT Home Exercise Plan: Blue Theraband  - flexion/extension, ab/adduction, IR/ER, horizontal ab/adduction OT Patient Instructions: Explained and demonstrated. Handout provided. pt return demonstrated and verbalized  understanding. Consulted and Agree with Plan of Care: Patient  [place], [place], OTR/L [phone]  12/13/2013, 3:53 PM  Physician Documentation Your signature is required to indicate approval of the treatment plan as stated above.  Please sign and either send electronically or make a copy of this report for your files and return this physician signed original.  Please mark one 1.__approve of plan  2. ___approve of plan with the following conditions.   ______________________________                                                          _____________________ Physician Signature                                                                                                             Date

## 2013-12-17 ENCOUNTER — Ambulatory Visit: Payer: 59 | Admitting: Family Medicine

## 2013-12-20 ENCOUNTER — Ambulatory Visit (HOSPITAL_COMMUNITY)
Admission: RE | Admit: 2013-12-20 | Discharge: 2013-12-20 | Disposition: A | Discharge status: Home / Self Care | Payer: 59 | Source: Ambulatory Visit | Attending: Family Medicine | Admitting: Family Medicine

## 2013-12-20 NOTE — Progress Notes (Signed)
Occupational Therapy Treatment Patient Details  Name: MIKI BLANK MRN: [id number] Date of Birth: [birth year]  Today's Date: 12/20/2013 Time: 0109-3235 OT Time Calculation (min): 40 min Manual 1437-1452 (15') Therapeutic Exercises 1452-1517 (25')  Visit#: 2 of 4  Re-eval: 01/10/14    Authorization: [employer]  Authorization Time Period: If [employer] network - 24 visits max per year  Authorization Visit#:   of    Subjective Symptoms/Limitations Symptoms: They're going well - there's two of them I can really feel." Pain Assessment Currently in Pain?: No/denies  Precautions/Restrictions     Exercise/Treatments Supine Protraction: PROM;5 reps;Strengthening;15 reps;Weights Protraction Weight (lbs): 2 Horizontal ABduction: PROM;5 reps;Strengthening;15 reps;Weights Horizontal ABduction Weight (lbs): 2 External Rotation: PROM;5 reps;Strengthening;15 reps;Weights External Rotation Weight (lbs): 2 Internal Rotation: PROM;5 reps;Strengthening;15 reps;Weights Internal Rotation Weight (lbs): 2 Flexion: PROM;5 reps;Strengthening;15 reps;Weights Shoulder Flexion Weight (lbs): 2 ABduction: PROM;5 reps;Strengthening;15 reps;Weights Shoulder ABduction Weight (lbs): 2 Standing Protraction: Strengthening;15 reps;Weights Protraction Weight (lbs): 3 Horizontal ABduction: Strengthening;Weights;15 reps Horizontal ABduction Weight (lbs): 3 External Rotation: Strengthening;15 reps;Weights External Rotation Weight (lbs): 3 Internal Rotation: Strengthening;15 reps;Weights Internal Rotation Weight (lbs): 3 Flexion: Strengthening;15 reps;Weights Shoulder Flexion Weight (lbs): 3 ABduction: Strengthening;15 reps;Weights Shoulder ABduction Weight (lbs): 3 ROM / Strengthening / Isometric Strengthening "W" Arms: 15x with 3# weights X to V Arms: 15x with 3# weight Proximal Shoulder Strengthening, Seated: 15x with 3# weight, in standing Ball on Wall: 1:30 each in flexio and abduction, with  red weighted ball   Manual Therapy Manual Therapy: Myofascial release Myofascial Release: MFR and manual stretching to right upper arm and scapuler regions to decrease fascial restrictions and promote decreased pain in activt motion.  Occupational Therapy Assessment and Plan OT Assessment and Plan Clinical Impression Statement: Pt verbalizes doing exercises at home, with some decreased pain when playing softball already. Added 2 and 3# weights to exercises, proximal shoudler strenghtenni, and ball on wall. pt verbalized feeling sore after workout, but toelrated all exercises well.  pt asking benefits of exercise for current shoulder issue, and OTR expained benefits of strengtheing surrounding areas. OT Plan: P: [place] exercises in prone.  Add to HEP.   Goals Long Term Goals Long Term Goal 1: Pt will be educated on HEP Long Term Goal 1 Progress: Progressing toward goal Long Term Goal 2: Pt will decrease fascial restrictions to minimal or less in RUE for decreaesed pain with rapid AROM. Long Term Goal 2 Progress: Progressing toward goal Long Term Goal 3: Pt will improve strength in RUE to 5/5 for improved tolerance of weightbearing while playing with the dog. Long Term Goal 3 Progress: Progressing toward goal Long Term Goal 4: Pt will verbalize pain with throwing long distances of less than 2/10 in RUE. Long Term Goal 4 Progress: Progressing toward goal  Problem List Patient Active Problem List   Diagnosis Date Noted  . Pain in joint, shoulder region 12/13/2013  . Tight fascia 12/13/2013  . Muscle weakness (generalized) 12/13/2013  . 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 CLOSED FRACTURE OF ASTRAGALUS 08/25/2010  . SPRAIN AND STRAIN OF DELTOID OF ANKLE 07/30/2010  . SPRAIN AND STRAIN OF CALCANEOFIBULAR 07/30/2010  . OTHER ANKLE SPRAIN AND STRAIN 07/30/2010    End of Session Activity Tolerance: Patient tolerated treatment well General Behavior During Therapy: [facility] for tasks  assessed/performed  GO    Bea Graff, [place], OTR/L [phone]  12/20/2013, 5:45 PM

## 2013-12-27 ENCOUNTER — Ambulatory Visit (HOSPITAL_COMMUNITY)
Admission: RE | Admit: 2013-12-27 | Discharge: 2013-12-27 | Disposition: A | Discharge status: Home / Self Care | Payer: 59 | Source: Ambulatory Visit | Attending: Family Medicine | Admitting: Family Medicine

## 2013-12-27 DIAGNOSIS — M25511 Pain in right shoulder: Secondary | ICD-10-CM

## 2013-12-27 DIAGNOSIS — M6281 Muscle weakness (generalized): Secondary | ICD-10-CM

## 2013-12-27 DIAGNOSIS — M629 Disorder of muscle, unspecified: Secondary | ICD-10-CM

## 2013-12-27 DIAGNOSIS — M6289 Other specified disorders of muscle: Secondary | ICD-10-CM

## 2013-12-27 NOTE — Progress Notes (Signed)
Occupational Therapy Treatment Patient Details  Name: Paul Watson MRN: [id number] Date of Birth: June 26, 1985  Today's Date: 12/27/2013 Time: 4270-6237 OT Time Calculation (min): 40 min MFR 6283-1517 13' Therex 6160-7371 27'  Visit#: 3 of 4  Re-eval: 01/10/14    Authorization: [employer]  Authorization Time Period: If [employer] network - 24 visits max per year  Authorization Visit#:   of    Subjective Symptoms/Limitations Symptoms: S: I haven't been playing outfield since my shoulder started to hurt. Pain Assessment Currently in Pain?: No/denies  Precautions/Restrictions  Precautions Precautions: None  Exercise/Treatments Supine Protraction: PROM;5 reps;Strengthening;15 reps;Weights Protraction Weight (lbs): 3 Horizontal ABduction: PROM;5 reps;Strengthening;15 reps;Weights Horizontal ABduction Weight (lbs): 3 External Rotation: PROM;5 reps;Strengthening;15 reps;Weights (arms abducted) External Rotation Weight (lbs): 3 Internal Rotation: PROM;5 reps;Strengthening;15 reps;Weights (arms abducted) Internal Rotation Weight (lbs): 3 Flexion: PROM;5 reps;Strengthening;15 reps;Weights Shoulder Flexion Weight (lbs): 3 ABduction: PROM;5 reps;Strengthening;15 reps;Weights Shoulder ABduction Weight (lbs): 3 Prone  Other Prone Exercises: Hughston exercises 10X Other Prone Exercises: shoulder abduction to internal rotation behind back; 10X Standing Protraction: Strengthening;15 reps;Weights Protraction Weight (lbs): 3 Horizontal ABduction: Strengthening;Weights;15 reps Horizontal ABduction Weight (lbs): 3 External Rotation: Strengthening;15 reps;Weights (arms abducted) External Rotation Weight (lbs): 3 Internal Rotation: Strengthening;15 reps;Weights (arms abducted) Internal Rotation Weight (lbs): 3 Flexion: Strengthening;15 reps;Weights Shoulder Flexion Weight (lbs): 3 ABduction: Strengthening;15 reps;Weights Shoulder ABduction Weight (lbs): 3 ROM / Strengthening /  Isometric Strengthening UBE (Upper Arm Bike): Level 3 3' reverse 3' forward Proximal Shoulder Strengthening, Seated: 15x with 3# weight, in standing Other ROM/Strengthening Exercises: Dislocates (pvc pipe) 10X Other ROM/Strengthening Exercises: Internal Rotation stretch with pvc pipe. 1' hold        Manual Therapy Manual Therapy: Myofascial release Myofascial Release: Myofascial release (MFR) and manual stretching to right upper arm and scapuler regions to decrease fascial restrictions and promote decreased pain in active motion  Occupational Therapy Assessment and Plan OT Assessment and Plan Clinical Impression Statement: A: Added internal rotation stretches with pvc pipe, dislocates, theraband flexion stretch, Hughston exercises, and UBE bike. Patient tolerated well. Patient was given Hughston exercises to complete during HEP. OT Plan: P: Add Cybex Row/Press D/C manual stretching.   Goals Long Term Goals Time to Complete Long Term Goals: 4 weeks Long Term Goal 1: Pt will be educated on HEP Long Term Goal 1 Progress: Progressing toward goal Long Term Goal 2: Pt will decrease fascial restrictions to minimal or less in RUE for decreaesed pain with rapid AROM. Long Term Goal 2 Progress: Progressing toward goal Long Term Goal 3: Pt will improve strength in RUE to 5/5 for improved tolerance of weightbearing while playing with the dog. Long Term Goal 3 Progress: Progressing toward goal Long Term Goal 4: Pt will verbalize pain with throwing long distances of less than 2/10 in RUE. Long Term Goal 4 Progress: Progressing toward goal  Problem List Patient Active Problem List   Diagnosis Date Noted  . Pain in joint, shoulder region 12/13/2013  . Tight fascia 12/13/2013  . Muscle weakness (generalized) 12/13/2013  . 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 CLOSED FRACTURE OF ASTRAGALUS 08/25/2010  . SPRAIN AND STRAIN OF DELTOID OF ANKLE 07/30/2010  . SPRAIN AND STRAIN OF CALCANEOFIBULAR 07/30/2010   . OTHER ANKLE SPRAIN AND STRAIN 07/30/2010    End of Session Activity Tolerance: Patient tolerated treatment well General Behavior During Therapy: [facility] for tasks assessed/performed OT Plan of Care OT Home Exercise Plan: Hughston Exercises OT Patient Instructions: handout (scanned) Consulted and Agree with Plan of Care:  Patient   Ailene Ravel, OTR/L,CBIS   12/27/2013, 3:51 PM

## 2014-01-03 ENCOUNTER — Ambulatory Visit (HOSPITAL_COMMUNITY)
Admission: RE | Admit: 2014-01-03 | Discharge: 2014-01-03 | Disposition: A | Discharge status: Home / Self Care | Payer: 59 | Source: Ambulatory Visit | Attending: Family Medicine | Admitting: Family Medicine

## 2014-01-03 NOTE — Progress Notes (Signed)
Occupational Therapy Treatment Patient Details  Name: Paul Watson MRN: [id number] Date of Birth: Nov 11, 1985  Today's Date: 01/03/2014 Time: 6294-7654 OT Time Calculation (min): 38 min There Ex 38'  Visit#: 4 of 4  Re-eval: 01/10/14    Authorization: [employer]  Authorization Time Period: If [employer] network - 24 visits max per year  Authorization Visit#:   of    Subjective Pain Assessment Currently in Pain?: No/denies  Precautions/Restrictions     Exercise/Treatments Prone  Other Prone Exercises: Hughston exercises 1-6 10X each with 2# weights Other Prone Exercises: Quadruped - with blue pad taking arms off eash side laterally 10x.  with BOSU, walking hands down to mat and up ball 10x each Standing Protraction: Strengthening;15 reps Protraction Weight (lbs): 4 Horizontal ABduction: Strengthening;15 reps Horizontal ABduction Weight (lbs): 4 External Rotation: Strengthening;15 reps External Rotation Weight (lbs): 4 Internal Rotation: Strengthening;15 reps Internal Rotation Weight (lbs): 4 Flexion: Strengthening;15 reps Shoulder Flexion Weight (lbs): 4 ABduction: Strengthening;15 reps Shoulder ABduction Weight (lbs): 4 ROM / Strengthening / Isometric Strengthening Cybex Press: 15 reps (8 plate) Cybex Row: 15 reps (5 plate) "W" Arms: 15x with 4# weights X to V Arms: 15x with 4# weights Proximal Shoulder Strengthening, Seated: 15x with 4# weight, in standing Ball on Wall: 1:30 each in flexio and abduction, with red weighted ball Other ROM/Strengthening Exercises: Dislocates (pvc pipe) 15X each direction Other ROM/Strengthening Exercises: Internal Rotation stretch with pvc pipe. 1' hold     Occupational Therapy Assessment and Plan OT Assessment and Plan Clinical Impression Statement: Pt reports playing baseball yesterday and having moment of increased pain when trowing from 2nd to homebase. Pain subsided after 5 minutes.  Added cybex press and row this session  - pt had good tolerance.  Added quadruped exercises for shoulder stabilization - pt tolerated well and verbalized good workout. OT Plan: continue Quadruped exercises. Re-evaluate   Goals Long Term Goals Long Term Goal 1: Pt will be educated on HEP Long Term Goal 1 Progress: Progressing toward goal Long Term Goal 2: Pt will decrease fascial restrictions to minimal or less in RUE for decreaesed pain with rapid AROM. Long Term Goal 2 Progress: Progressing toward goal Long Term Goal 3: Pt will improve strength in RUE to 5/5 for improved tolerance of weightbearing while playing with the dog. Long Term Goal 3 Progress: Progressing toward goal Long Term Goal 4: Pt will verbalize pain with throwing long distances of less than 2/10 in RUE. Long Term Goal 4 Progress: Progressing toward goal  Problem List Patient Active Problem List   Diagnosis Date Noted  . Pain in joint, shoulder region 12/13/2013  . Tight fascia 12/13/2013  . Muscle weakness (generalized) 12/13/2013  . 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 CLOSED FRACTURE OF ASTRAGALUS 08/25/2010  . SPRAIN AND STRAIN OF DELTOID OF ANKLE 07/30/2010  . SPRAIN AND STRAIN OF CALCANEOFIBULAR 07/30/2010  . OTHER ANKLE SPRAIN AND STRAIN 07/30/2010    End of Session Activity Tolerance: Patient tolerated treatment well General Behavior During Therapy: [facility] for tasks assessed/performed  GO    Bea Graff, MS, OTR/L [phone]  01/03/2014, 3:22 PM

## 2014-01-10 ENCOUNTER — Ambulatory Visit (HOSPITAL_COMMUNITY)
Admission: RE | Admit: 2014-01-10 | Discharge: 2014-01-10 | Disposition: A | Discharge status: Home / Self Care | Payer: 59 | Source: Ambulatory Visit | Attending: Orthopedic Surgery | Admitting: Orthopedic Surgery

## 2014-01-10 DIAGNOSIS — M25511 Pain in right shoulder: Secondary | ICD-10-CM

## 2014-01-10 DIAGNOSIS — M6281 Muscle weakness (generalized): Secondary | ICD-10-CM

## 2014-01-10 DIAGNOSIS — M6289 Other specified disorders of muscle: Secondary | ICD-10-CM

## 2014-01-10 DIAGNOSIS — M629 Disorder of muscle, unspecified: Secondary | ICD-10-CM

## 2014-01-10 NOTE — Evaluation (Signed)
Occupational Therapy Reassessment and Discharge  Patient Details  Name: Paul Watson MRN: [id number] Date of Birth: 01/03/1986  Today's Date: 01/10/2014 Time: 1430-1450 OT Time Calculation (min): 20 min Reassess 1430-1450 20'  Visit#: 5 of 5  Re-eval:    Assessment Diagnosis: Right Shoulder Pain  Authorization: [employer]  Authorization Time Period: If [employer] network - 24 visits max per year  Authorization Visit#:   of     Past Medical History: No past medical history on file. Past Surgical History: No past surgical history on file.  Subjective Symptoms/Limitations Symptoms: S: It only hurts if I throw really hard during a game. Other than that it's been feeling great.  Special Tests: FOTO score: 87/100 Pain Assessment Currently in Pain?: No/denies  Precautions/Restrictions  Precautions Precautions: None   Assessment Additional Assessments RUE AROM (degrees) RUE Overall AROM Comments: Assessed in sitting Right Shoulder Flexion: 172 Degrees (on eval: 171) Right Shoulder ABduction: 162 Degrees (on eval: 159) Right Shoulder Internal Rotation: 80 Degrees (on eval: 95 with shoulder adducted) Right Shoulder External Rotation: 111 Degrees (on eval: 65 with shoulder adducted) RUE Strength RUE Overall Strength Comments: Assessed in sitting Right Shoulder Flexion: 5/5 (on eval: 4+/5) Right Shoulder ABduction: 5/5 (same on eval) Right Shoulder Internal Rotation: 5/5 (on eval: 4+/5) Right Shoulder External Rotation: 5/5 (on eval: 4+/5) Palpation Palpation: Trace fascial restrictions in bicep, upper arm, trapezius, and scapularis region.       Occupational Therapy Assessment and Plan OT Assessment and Plan Clinical Impression Statement: A: Reassessment and discharge completed this date. Patient has met 3/4  therapy goals. Patient reports no pain in right shoulder unless he is throwing hard during a game. Patient has been instructed to continue completing HEP at  home and to try and rest shoulder as much as he can. Reviewed the importance of stretching right shoulder. Patient is agreeable to discharge.  OT Plan: P: D/C from therapy.    Goals Long Term Goals Time to Complete Long Term Goals: 4 weeks Long Term Goal 1: Pt will be educated on HEP Long Term Goal 1 Progress: Met Long Term Goal 2: Pt will decrease fascial restrictions to minimal or less in RUE for decreaesed pain with rapid AROM. Long Term Goal 2 Progress: Met Long Term Goal 3: Pt will improve strength in RUE to 5/5 for improved tolerance of weightbearing while playing with the dog. Long Term Goal 3 Progress: Met Long Term Goal 4: Pt will verbalize pain with throwing long distances of less than 2/10 in RUE. Long Term Goal 4 Progress: Not met  Problem List Patient Active Problem List   Diagnosis Date Noted  . Pain in joint, shoulder region 12/13/2013  . Tight fascia 12/13/2013  . Muscle weakness (generalized) 12/13/2013  . 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 CLOSED FRACTURE OF ASTRAGALUS 08/25/2010  . SPRAIN AND STRAIN OF DELTOID OF ANKLE 07/30/2010  . SPRAIN AND STRAIN OF CALCANEOFIBULAR 07/30/2010  . OTHER ANKLE SPRAIN AND STRAIN 07/30/2010    End of Session Activity Tolerance: Patient tolerated treatment well General Behavior During Therapy: WFL for tasks assessed/performed   Laura Essenmacher, OTR/L,CBIS   01/10/2014, 3:05 PM  Physician Documentation Your signature is required to indicate approval of the treatment plan as stated above.  Please sign and either send electronically or make a copy of this report for your files and return this physician signed original.  Please mark one 1.__approve of plan  2. ___approve of plan with the following conditions.     ______________________________                                                          _____________________ Physician Signature                                                                                                              Date

## 2014-11-08 ENCOUNTER — Ambulatory Visit (INDEPENDENT_AMBULATORY_CARE_PROVIDER_SITE_OTHER): Payer: 59 | Admitting: Family Medicine

## 2014-11-08 ENCOUNTER — Encounter: Payer: Self-pay | Admitting: Family Medicine

## 2014-11-08 VITALS — BP 136/90 | Temp 98.5°F | Ht 72.0 in | Wt 192.0 lb

## 2014-11-08 DIAGNOSIS — N451 Epididymitis: Secondary | ICD-10-CM | POA: Diagnosis not present

## 2014-11-08 DIAGNOSIS — R358 Other polyuria: Secondary | ICD-10-CM | POA: Diagnosis not present

## 2014-11-08 DIAGNOSIS — R3589 Other polyuria: Secondary | ICD-10-CM

## 2014-11-08 LAB — POCT URINALYSIS DIPSTICK
PH UA: 7
SPEC GRAV UA: 1.02

## 2014-11-08 MED ORDER — DOXYCYCLINE HYCLATE 100 MG PO TABS: 100.0000 mg | ORAL_TABLET | Freq: Two times a day (BID) | ORAL | Status: DC

## 2014-11-08 NOTE — Progress Notes (Signed)
   Subjective:    Patient ID: Paul Watson, male    DOB: 1985/12/17, 29 y.o.   MRN: [id number]  HPIpt arrives today to follow up on recurrent prostate infections.   No major frequency  Swelling on the left side of the testicle Bit sore  No fever or chills  Some headache at times   No sig pain with sexual interaction  No discharge positive history of prostate infections   Review of Systems No headache no chest pain no cough    Objective:   Physical Exam Alert vitals stable lungs clear. Heart regular in rhythm left posterior testicle tenderness swollen inflammation   Urinalysis unremarkable    Assessment & Plan:  Impression acute epididymitis left plan antibiotics prescribed. Local measures discussed. WSL

## 2015-11-17 DIAGNOSIS — D235 Other benign neoplasm of skin of trunk: Secondary | ICD-10-CM | POA: Diagnosis not present

## 2016-03-12 ENCOUNTER — Ambulatory Visit (INDEPENDENT_AMBULATORY_CARE_PROVIDER_SITE_OTHER): Payer: 59 | Admitting: Family Medicine

## 2016-03-12 ENCOUNTER — Encounter: Payer: Self-pay | Admitting: Family Medicine

## 2016-03-12 VITALS — BP 142/94 | Ht 72.0 in | Wt 199.0 lb

## 2016-03-12 DIAGNOSIS — M545 Low back pain, unspecified: Secondary | ICD-10-CM

## 2016-03-12 MED ORDER — ETODOLAC 400 MG PO TABS: 400.0000 mg | ORAL_TABLET | Freq: Two times a day (BID) | ORAL | 1 refills | Status: DC

## 2016-03-12 MED ORDER — TIZANIDINE HCL 4 MG PO TABS: 4.0000 mg | ORAL_TABLET | Freq: Three times a day (TID) | ORAL | 1 refills | Status: DC

## 2016-03-12 NOTE — Progress Notes (Signed)
   Subjective:    Patient ID: Paul Watson, male    DOB: June 08, 1986, 30 y.o.   MRN: [id number]  Back Pain  This is a new problem. The current episode started yesterday. The pain is present in the lumbar spine. He has tried NSAIDs for the symptoms.   All the way across, occurred immed at the tie of dead lifting  Pt went on to work, and [employer] aoften caused sudden flare of pain  No wrking this weekend Minimal radiation into legs  Review of Systems  Musculoskeletal: Positive for back pain.  No dysuria no GI symptoms     Objective:   Physical Exam        Assessment & Plan:  Alert vitals stable, NAD. Blood pressure good on repeat. HEENT normal. Lungs clear. Heart regular rate and rhythm. Low back diffuse mild tenderness percussion negative straight leg raise bilateral  Lumbar strain with history of chronic intermittent low back pain plan anti-inflammatory medicine prescribed. Muscle spasm as prescribed. Hold off and x-rays and scans rationale discussed exercises discussed in encourage for long-term WSL

## 2016-03-12 NOTE — Patient Instructions (Signed)

## 2016-03-15 DIAGNOSIS — H52222 Regular astigmatism, left eye: Secondary | ICD-10-CM | POA: Diagnosis not present

## 2016-03-15 DIAGNOSIS — H5213 Myopia, bilateral: Secondary | ICD-10-CM | POA: Diagnosis not present

## 2016-03-15 DIAGNOSIS — D3131 Benign neoplasm of right choroid: Secondary | ICD-10-CM | POA: Diagnosis not present

## 2016-03-15 DIAGNOSIS — H43811 Vitreous degeneration, right eye: Secondary | ICD-10-CM | POA: Diagnosis not present

## 2016-09-20 ENCOUNTER — Ambulatory Visit (INDEPENDENT_AMBULATORY_CARE_PROVIDER_SITE_OTHER): Payer: 59 | Admitting: Family Medicine

## 2016-09-20 ENCOUNTER — Other Ambulatory Visit: Payer: Self-pay | Admitting: *Deleted

## 2016-09-20 ENCOUNTER — Encounter: Payer: Self-pay | Admitting: Family Medicine

## 2016-09-20 VITALS — BP 118/76 | Temp 98.8°F | Ht 72.0 in | Wt 208.0 lb

## 2016-09-20 DIAGNOSIS — N451 Epididymitis: Secondary | ICD-10-CM

## 2016-09-20 MED ORDER — DOXYCYCLINE HYCLATE 100 MG PO TABS: 100.0000 mg | ORAL_TABLET | Freq: Two times a day (BID) | ORAL | 0 refills | Status: DC

## 2016-09-20 MED ORDER — SCOPOLAMINE 1 MG/3DAYS TD PT72: 1.0000 | MEDICATED_PATCH | TRANSDERMAL | 0 refills | Status: DC

## 2016-09-20 NOTE — Progress Notes (Signed)
   Subjective:    Patient ID: Paul Watson, male    DOB: March 06, 1986, 31 y.o.   MRN: [id number]  HPITesticle pain and swelling off and on for a few months.   Always on the left side,  Dull pain and certain motions cause discomfort, will have dul pain that last about thirty sconds  Notes swelling intermittently   No difficulty with urination, no nocturia, no burnign with urination  worksat [employer] doing [employer] and me  Review of Systems No headache, no major weight loss or weight gain, no chest pain no back pain abdominal pain no change in bowel habits complete ROS otherwise negative     Objective:   Physical Exam  Alert vitals stable, NAD. Blood pressure good on repeat. HEENT normal. Lungs clear. Heart regular rate and rhythm. Groin reveals left posterior epididymis tenderness. No masses testicle normal. No hernia      Assessment & Plan:  Subacute epididymitis plan doxycycline twice a day 14 days. Symptom care discussed warning signs discussed. Also given Transderm scope for pending trip

## 2016-11-17 DIAGNOSIS — D4989 Neoplasm of unspecified behavior of other specified sites: Secondary | ICD-10-CM | POA: Diagnosis not present

## 2016-11-17 DIAGNOSIS — D225 Melanocytic nevi of trunk: Secondary | ICD-10-CM | POA: Diagnosis not present

## 2016-11-17 DIAGNOSIS — D485 Neoplasm of uncertain behavior of skin: Secondary | ICD-10-CM | POA: Diagnosis not present

## 2016-11-17 DIAGNOSIS — D235 Other benign neoplasm of skin of trunk: Secondary | ICD-10-CM | POA: Diagnosis not present

## 2016-11-17 DIAGNOSIS — B079 Viral wart, unspecified: Secondary | ICD-10-CM | POA: Diagnosis not present

## 2016-12-02 DIAGNOSIS — D485 Neoplasm of uncertain behavior of skin: Secondary | ICD-10-CM | POA: Diagnosis not present

## 2016-12-07 ENCOUNTER — Ambulatory Visit: Payer: 59

## 2016-12-07 ENCOUNTER — Telehealth: Payer: Self-pay | Admitting: Orthopaedic Surgery

## 2016-12-07 ENCOUNTER — Encounter: Payer: Self-pay | Admitting: Orthopaedic Surgery

## 2016-12-07 ENCOUNTER — Ambulatory Visit (INDEPENDENT_AMBULATORY_CARE_PROVIDER_SITE_OTHER): Payer: 59

## 2016-12-07 ENCOUNTER — Ambulatory Visit (INDEPENDENT_AMBULATORY_CARE_PROVIDER_SITE_OTHER): Payer: 59 | Admitting: Orthopaedic Surgery

## 2016-12-07 VITALS — BP 138/75 | HR 73 | Temp 97.1°F | Ht 72.0 in | Wt 208.0 lb

## 2016-12-07 DIAGNOSIS — M25571 Pain in right ankle and joints of right foot: Secondary | ICD-10-CM | POA: Diagnosis not present

## 2016-12-07 DIAGNOSIS — S96911A Strain of unspecified muscle and tendon at ankle and foot level, right foot, initial encounter: Secondary | ICD-10-CM | POA: Diagnosis not present

## 2016-12-07 NOTE — Telephone Encounter (Signed)
Dr. Luna Glasgow wrote an Rx for the patient to pick up and get the ankle brace from [facility].  I called the patient and told him to pick it up.

## 2016-12-07 NOTE — Progress Notes (Signed)
Subjective:    Patient ID: Paul Watson, male    DOB: May 28, 1986, 31 y.o.   MRN: [id number]  HPI He twisted his right ankle playing softball yesterday and hurt his ankle.  He has had swelling, lateral pain.  He has no other injury.  He has elevated it.  He had crutches at home and has begun using them.  He has used ice and Advil.  It still hurts.  He has prior surgery on the right ankle in 2013.   Review of Systems  HENT: Negative for congestion.   Respiratory: Negative for cough and shortness of breath.   Cardiovascular: Negative for chest pain and leg swelling.  Endocrine: Negative for cold intolerance.  Musculoskeletal: Positive for arthralgias, gait problem and joint swelling.  Allergic/Immunologic: Negative for environmental allergies.   Past Medical History:  Diagnosis Date  . Fractures     Past Surgical History:  Procedure Laterality Date  . ANKLE FRACTURE SURGERY      Current Outpatient Prescriptions on File Prior to Visit  Medication Sig Dispense Refill  . doxycycline (VIBRA-TABS) 100 MG tablet Take 1 tablet (100 mg total) by mouth 2 (two) times daily. (Patient not taking: Reported on 12/07/2016) 28 tablet 0  . scopolamine (TRANSDERM-SCOP, 1.5 MG,) 1 MG/3DAYS Place 1 patch (1.5 mg total) onto the skin every 3 (three) days. (Patient not taking: Reported on 12/07/2016) 4 patch 0   No current facility-administered medications on file prior to visit.     Social History   Social History  . Marital status: Married    Spouse name: N/A  . Number of children: N/A  . Years of education: college    Occupational History  . Not on file.   Social History Main Topics  . Smoking status: Never Smoker  . Smokeless tobacco: Never Used  . Alcohol use No  . Drug use: Unknown  . Sexual activity: Not on file   Other Topics Concern  . Not on file   Social History Narrative  . No narrative on file    Family History  Problem Relation Age of Onset  . Arthritis Unknown         family history   . Melanoma Maternal Grandmother     BP 138/75   Pulse 73   Temp 97.1 F (36.2 C)   Ht 6' (1.829 m)   Wt 208 lb (94.3 kg)   BMI 28.21 kg/m      Objective:   Physical Exam  Constitutional: He is oriented to person, place, and time. He appears well-developed and well-nourished.  HENT:  Head: Normocephalic and atraumatic.  Eyes: Conjunctivae and EOM are normal. Pupils are equal, round, and reactive to light.  Neck: Normal range of motion. Neck supple.  Cardiovascular: Normal rate, regular rhythm and intact distal pulses.   Pulmonary/Chest: Effort normal.  Abdominal: Soft.  Musculoskeletal: He exhibits tenderness (Right ankle tender, lateral swelling, no ecchymosis, tender over the anterior talofibular ligament, ROM decreased secondary to pain. using crutches, NV intact.  Left ankle negative.).  Neurological: He is alert and oriented to person, place, and time. He has normal reflexes. He displays normal reflexes. No cranial nerve deficit. He exhibits normal muscle tone. Coordination normal.  Skin: Skin is warm and dry.  Psychiatric: He has a normal mood and affect. His behavior is normal. Judgment and thought content normal.  Vitals reviewed.   X-rays were done of the right ankle, reported separately.      Assessment & Plan:  Encounter Diagnoses  Name Primary?  . Pain in joint involving right ankle and foot Yes  . Ankle strain, right, initial encounter    I have explained findings that he has a strain of the right ankle.  I have given instructions for contrast baths.  Take Tylenol plus Advil for pain  Continue crutches and ice.  He has an ankle brace lace up at home.  He is to use this.  Return in two weeks.  Call if any problem.  Precautions discussed.   Electronically Signed Sanjuana Kava, MD 6/26/201810:18 AM

## 2016-12-07 NOTE — Telephone Encounter (Signed)
Patient and wife called to relay that they were offered an ASO brace from Dr Luna Glasgow but thought they had one at home; states they've checked, and do not. Asking if can come by this afternoon to pick up a brace? Ph [phone]

## 2016-12-21 ENCOUNTER — Ambulatory Visit: Payer: 59 | Admitting: Orthopaedic Surgery

## 2017-01-04 ENCOUNTER — Ambulatory Visit (INDEPENDENT_AMBULATORY_CARE_PROVIDER_SITE_OTHER): Payer: 59 | Admitting: Family Medicine

## 2017-01-04 VITALS — Temp 98.7°F | Ht 72.0 in | Wt 206.2 lb

## 2017-01-04 DIAGNOSIS — H6502 Acute serous otitis media, left ear: Secondary | ICD-10-CM

## 2017-01-04 MED ORDER — CEFDINIR 300 MG PO CAPS: 300.0000 mg | ORAL_CAPSULE | Freq: Two times a day (BID) | ORAL | 0 refills | Status: DC

## 2017-01-04 NOTE — Progress Notes (Signed)
   Subjective:    Patient ID: Paul Watson, male    DOB: May 04, 1986, 31 y.o.   MRN: [id number]  HPI Patient arrives with c/o left ear pain for 2 weeks. Patient was given Amoxicillin 2 weeks ago via tele -visit but still having ear pain.  Two weeks ago  Used md live  They did some abx  Wrote rx over the phone did not help  Non smoker  No sig resp cong and dranage     Review of Systems No headache, no major weight loss or weight gain, no chest pain no back pain abdominal pain no change in bowel habits complete ROS otherwise negative     Objective:   Physical Exam Alert, mild malaise. Hydration good Vitals stable. Left otitis media evident evident positive nasal congestion. pharynx normal neck supple  lungs clear/no crackles or wheezes. heart regular in rhythm        Assessment & Plan:  Impression Left otitis media likely post viral, persistent discussed with patient. plan antibiotics prescribed. Questions answered. Symptomatic care discussed. warning signs discussed. WSL

## 2017-01-05 ENCOUNTER — Ambulatory Visit: Payer: 59 | Admitting: Family Medicine

## 2017-09-19 ENCOUNTER — Encounter: Payer: Self-pay | Admitting: Family Medicine

## 2017-09-19 ENCOUNTER — Encounter: Payer: Self-pay | Admitting: Nurse Practitioner

## 2017-09-19 ENCOUNTER — Ambulatory Visit: Payer: 59 | Admitting: Nurse Practitioner

## 2017-09-19 VITALS — BP 130/88 | Temp 99.2°F | Ht 72.0 in | Wt 207.0 lb

## 2017-09-19 DIAGNOSIS — L03811 Cellulitis of head [any part, except face]: Secondary | ICD-10-CM | POA: Diagnosis not present

## 2017-09-19 DIAGNOSIS — J069 Acute upper respiratory infection, unspecified: Secondary | ICD-10-CM | POA: Diagnosis not present

## 2017-09-19 DIAGNOSIS — B9689 Other specified bacterial agents as the cause of diseases classified elsewhere: Secondary | ICD-10-CM

## 2017-09-19 MED ORDER — AMOXICILLIN-POT CLAVULANATE 875-125 MG PO TABS: 1.0000 | ORAL_TABLET | Freq: Two times a day (BID) | ORAL | 0 refills | Status: DC

## 2017-09-19 NOTE — Progress Notes (Signed)
Subjective:  Presents for c/o sinus drainage and sore throat for the past 1-1 1/2 weeks. No fever. No headache. Runny nose. Cough worse x 2 d. Non productive. No wheezing or ear pain. Also has enlarged lymph nodes on the left neck area. When asked about scalp rash or infection, mentions a sore area on the left side.   Objective:   BP 130/88   Temp 99.2 F (37.3 C) (Oral)   Ht 6' (1.829 m)   Wt 207 lb 0.2 oz (93.9 kg)   BMI 28.08 kg/m  NAD. Alert, oriented. TMs clear effusion. Pharynx mild erythema, green PND noted. Neck supple with mild anterior adenopathy. Several small left posterior cervical lymph nodes noted. Lungs clear. Heart RRR. Small localized slightly raised erythematous area left parietal scalp. Slightly tender to palpation. No drainage.   Assessment:  Bacterial upper respiratory infection  Cellulitis of scalp    Plan:   Meds ordered this encounter  Medications  . amoxicillin-clavulanate (AUGMENTIN) 875-125 MG tablet    Sig: Take 1 tablet by mouth 2 (two) times daily.    Dispense:  20 tablet    Refill:  0    Order Specific Question:   Supervising Provider    Answer:   Mikey Kirschner [[id number]]   OTC meds as directed for cough and congestion. Call back if symptoms worsen or persist. Warning signs reviewed.

## 2017-10-05 ENCOUNTER — Ambulatory Visit (INDEPENDENT_AMBULATORY_CARE_PROVIDER_SITE_OTHER): Payer: 59 | Admitting: Family Medicine

## 2017-10-05 ENCOUNTER — Encounter: Payer: Self-pay | Admitting: Family Medicine

## 2017-10-05 VITALS — BP 118/80 | Ht 71.5 in | Wt 203.0 lb

## 2017-10-05 DIAGNOSIS — Z13220 Encounter for screening for lipoid disorders: Secondary | ICD-10-CM | POA: Diagnosis not present

## 2017-10-05 DIAGNOSIS — Z0000 Encounter for general adult medical examination without abnormal findings: Secondary | ICD-10-CM

## 2017-10-05 NOTE — Progress Notes (Signed)
   Subjective:    Patient ID: Paul Watson, male    DOB: 12-29-1985, 32 y.o.   MRN: [id number]  HPI The patient comes in today for a wellness visit.    A review of their health history was completed.  A review of medications was also completed.  Any needed refills; none  Eating habits: health conscious  Falls/  MVA accidents in past few months: none  Regular exercise: yes 3 -5 times per week  Specialist pt sees on regular basis: none  Preventative health issues were discussed.   Additional concerns: heart - no problems just concerned about family history. Father and grandfather passed in their 17's.   Pt f smoked,  No hx of chol elev or testing for the matter   Eats god diet+    Watching it  Three to five per wk exercise fot yhe past two months   workstaying busy at [employer]  Review of Systems  Constitutional: Negative for activity change, appetite change and fever.  HENT: Negative for congestion and rhinorrhea.   Eyes: Negative for discharge.  Respiratory: Negative for cough and wheezing.   Cardiovascular: Negative for chest pain.  Gastrointestinal: Negative for abdominal pain, blood in stool and vomiting.  Genitourinary: Negative for difficulty urinating and frequency.  Musculoskeletal: Negative for neck pain.  Skin: Negative for rash.  Allergic/Immunologic: Negative for environmental allergies and food allergies.  Neurological: Negative for weakness and headaches.  Psychiatric/Behavioral: Negative for agitation.  All other systems reviewed and are negative.      Objective:   Physical Exam  Constitutional: He appears well-developed and well-nourished.  HENT:  Head: Normocephalic and atraumatic.  Right Ear: External ear normal.  Left Ear: External ear normal.  Nose: Nose normal.  Mouth/Throat: Oropharynx is clear and moist.  Eyes: Right eye exhibits no discharge. Left eye exhibits no discharge. No scleral icterus.  Neck: Normal range of motion. Neck  supple. No thyromegaly present.  Cardiovascular: Normal rate, regular rhythm and normal heart sounds.  No murmur heard. Pulmonary/Chest: Effort normal and breath sounds normal. No respiratory distress. He has no wheezes.  Abdominal: Soft. Bowel sounds are normal. He exhibits no distension and no mass. There is no tenderness.  Genitourinary: Penis normal.  Musculoskeletal: Normal range of motion. He exhibits no edema.  Lymphadenopathy:    He has no cervical adenopathy.  Neurological: He is alert. He exhibits normal muscle tone. Coordination normal.  Skin: Skin is warm and dry. No erythema.  Psychiatric: He has a normal mood and affect. His behavior is normal. Judgment normal.  Vitals reviewed.         Assessment & Plan:  1 impression well adult exam.  Generally healthy lifestyle.  Eating well.  Non-smoker.  Exercising frequently.  [employer]house manager.  Doing well at work.  Concerned about strong family history of coronary artery disease.  Importance of blood work reviewed with patient we will press on check accordingly.

## 2017-10-10 DIAGNOSIS — H52222 Regular astigmatism, left eye: Secondary | ICD-10-CM | POA: Diagnosis not present

## 2017-10-10 DIAGNOSIS — H5213 Myopia, bilateral: Secondary | ICD-10-CM | POA: Diagnosis not present

## 2017-10-15 DIAGNOSIS — Z13220 Encounter for screening for lipoid disorders: Secondary | ICD-10-CM | POA: Diagnosis not present

## 2017-10-15 DIAGNOSIS — Z0000 Encounter for general adult medical examination without abnormal findings: Secondary | ICD-10-CM | POA: Diagnosis not present

## 2017-10-16 LAB — BASIC METABOLIC PANEL
BUN/Creatinine Ratio: 11 (ref 9–20)
BUN: 11 mg/dL (ref 6–20)
CALCIUM: 9.2 mg/dL (ref 8.7–10.2)
CO2: 25 mmol/L (ref 20–29)
CREATININE: 1.03 mg/dL (ref 0.76–1.27)
Chloride: 102 mmol/L (ref 96–106)
GFR calc Af Amer: 113 mL/min/{1.73_m2} (ref >59)
GFR, EST NON AFRICAN AMERICAN: 98 mL/min/{1.73_m2} (ref >59)
Glucose: 91 mg/dL (ref 65–99)
POTASSIUM: 4.4 mmol/L (ref 3.5–5.2)
Sodium: 140 mmol/L (ref 134–144)

## 2017-10-16 LAB — HEPATIC FUNCTION PANEL
ALBUMIN: 4.4 g/dL (ref 3.5–5.5)
ALT: 25 IU/L (ref 0–44)
AST: 20 IU/L (ref 0–40)
Alkaline Phosphatase: 71 IU/L (ref 39–117)
BILIRUBIN TOTAL: 0.5 mg/dL (ref 0.0–1.2)
Bilirubin, Direct: 0.17 mg/dL (ref 0.00–0.40)
Total Protein: 6.8 g/dL (ref 6.0–8.5)

## 2017-10-16 LAB — LIPID PANEL
Chol/HDL Ratio: 3.5 ratio (ref 0.0–5.0)
Cholesterol, Total: 138 mg/dL (ref 100–199)
HDL: 40 mg/dL (ref >39)
LDL CALC: 84 mg/dL (ref 0–99)
Triglycerides: 70 mg/dL (ref 0–149)
VLDL CHOLESTEROL CAL: 14 mg/dL (ref 5–40)

## 2017-10-23 ENCOUNTER — Encounter: Payer: Self-pay | Admitting: Family Medicine

## 2017-11-16 DIAGNOSIS — L818 Other specified disorders of pigmentation: Secondary | ICD-10-CM | POA: Diagnosis not present

## 2017-11-16 DIAGNOSIS — D235 Other benign neoplasm of skin of trunk: Secondary | ICD-10-CM | POA: Diagnosis not present

## 2018-05-04 ENCOUNTER — Encounter: Payer: Self-pay | Admitting: Family Medicine

## 2018-05-04 ENCOUNTER — Ambulatory Visit: Payer: BC Managed Care – PPO | Admitting: Family Medicine

## 2018-05-04 VITALS — BP 122/70 | Ht 71.5 in | Wt 204.4 lb

## 2018-05-04 DIAGNOSIS — Z23 Encounter for immunization: Secondary | ICD-10-CM | POA: Diagnosis not present

## 2018-05-04 DIAGNOSIS — K4090 Unilateral inguinal hernia, without obstruction or gangrene, not specified as recurrent: Secondary | ICD-10-CM

## 2018-05-04 NOTE — Progress Notes (Signed)
   Subjective:    Patient ID: Paul Watson, male    DOB: Jul 12, 1985, 32 y.o.   MRN: [id number]  HPIpulled muscle in groin area about 2 weeks ago.   Would like a tdap and flu vaccine today.    Pt had discomfort   Occurred a couple weeks ago  When  It occurred rcalls no sig injury   Felt the pain when walking  Working at [employer], [place] No headache, no major weight loss or weight gain, no chest pain no back pain abdominal pain no change in bowel habits complete ROS otherwise negative     Objective:   Physical Exam  Alert vitals stable, NAD. Blood pressure good on repeat. HEENT normal. Lungs clear. Heart regular rate and rhythm.  Probable right inguinal hernia.  Right testicle normal old surgical line noted     Assessment & Plan:  1 impression probable right inguinal hernia.  Discussed.  There is site of old neonatal surgery as an infant regarding right inguinal hernia.  Surgery referral warranted discussed

## 2018-05-08 ENCOUNTER — Encounter: Payer: Self-pay | Admitting: Family Medicine

## 2018-05-25 ENCOUNTER — Encounter: Payer: Self-pay | Admitting: General Surgery

## 2018-05-25 ENCOUNTER — Ambulatory Visit: Payer: BC Managed Care – PPO | Admitting: General Surgery

## 2018-05-25 VITALS — BP 132/88 | HR 82 | Temp 97.5°F | Resp 18 | Wt 207.6 lb

## 2018-05-25 DIAGNOSIS — K4090 Unilateral inguinal hernia, without obstruction or gangrene, not specified as recurrent: Secondary | ICD-10-CM

## 2018-05-25 NOTE — Patient Instructions (Signed)

## 2018-05-25 NOTE — H&P (Signed)
Paul Watson; [id number]; Apr 12, 1986   HPI Patient is a 32 year old white male who was referred to my care by Dr. Mickie Hillier for evaluation treatment of a right anal hernia.  Patient states he has had intermittent swelling and discomfort in the right inguinal region for several months.  It does seem to be increasing in intensity.  He has noted a lump in that region.  Is made worse with straining.  He denies any nausea or vomiting.  He currently has 0 out of 10 abdominal pain. Past Medical History:  Diagnosis Date  . Fractures     Past Surgical History:  Procedure Laterality Date  . ANKLE FRACTURE SURGERY      Family History  Problem Relation Age of Onset  . Arthritis Unknown        family history   . Melanoma Maternal Grandmother     No current outpatient medications on file prior to visit.   No current facility-administered medications on file prior to visit.     No Known Allergies  Social History   Substance and Sexual Activity  Alcohol Use No    Social History   Tobacco Use  Smoking Status Never Smoker  Smokeless Tobacco Never Used    Review of Systems  Constitutional: Negative.   HENT: Negative.   Eyes: Negative.   Respiratory: Negative.   Cardiovascular: Negative.   Gastrointestinal: Negative.   Genitourinary: Negative.   Musculoskeletal: Negative.   Skin: Negative.   Neurological: Negative.   Endo/Heme/Allergies: Negative.   Psychiatric/Behavioral: Negative.     Objective   Vitals:   05/25/18 0958  BP: 132/88  Pulse: 82  Resp: 18  Temp: (!) 97.5 F (36.4 C)    Physical Exam Vitals signs reviewed.  Constitutional:      Appearance: Normal appearance. He is not ill-appearing.  HENT:     Head: Normocephalic and atraumatic.  Cardiovascular:     Rate and Rhythm: Normal rate and regular rhythm.     Heart sounds: Normal heart sounds. No murmur. No gallop.   Pulmonary:     Effort: Pulmonary effort is normal. No respiratory distress.   Breath sounds: Normal breath sounds. No stridor. No wheezing, rhonchi or rales.  Abdominal:     General: Abdomen is flat. Bowel sounds are normal. There is no distension.     Palpations: Abdomen is soft.     Tenderness: There is no abdominal tenderness. There is no guarding or rebound.     Hernia: A hernia is present.     Comments: Easily reducible right inguinal hernia noted  Genitourinary:    Comments: Genitourinary examination within normal limits Skin:    General: Skin is warm and dry.  Neurological:     Mental Status: He is alert and oriented to person, place, and time.  Psychiatric:        Mood and Affect: Mood normal.        Behavior: Behavior normal.        Thought Content: Thought content normal.        Judgment: Judgment normal.   Primary care notes reviewed  Assessment   Right inguinal hernia Plan   Patient is scheduled for right inguinal herniorrhaphy with mesh on 06/05/2018.  The risks and benefits of the procedure including bleeding, infection, mesh use, and the possibility of recurrence of the hernia were fully explained to the patient, who gave informed consent.

## 2018-05-25 NOTE — Progress Notes (Signed)
Paul Watson; [id number]; [birth year]   HPI Patient is a 32 year old white male who was referred to my care by Dr. Mickie Hillier for evaluation treatment of a right anal hernia.  Patient states he has had intermittent swelling and discomfort in the right inguinal region for several months.  It does seem to be increasing in intensity.  He has noted a lump in that region.  Is made worse with straining.  He denies any nausea or vomiting.  He currently has 0 out of 10 abdominal pain. Past Medical History:  Diagnosis Date  . Fractures     Past Surgical History:  Procedure Laterality Date  . ANKLE FRACTURE SURGERY      Family History  Problem Relation Age of Onset  . Arthritis Unknown        family history   . Melanoma Maternal Grandmother     No current outpatient medications on file prior to visit.   No current facility-administered medications on file prior to visit.     No Known Allergies  Social History   Substance and Sexual Activity  Alcohol Use No    Social History   Tobacco Use  Smoking Status Never Smoker  Smokeless Tobacco Never Used    Review of Systems  Constitutional: Negative.   HENT: Negative.   Eyes: Negative.   Respiratory: Negative.   Cardiovascular: Negative.   Gastrointestinal: Negative.   Genitourinary: Negative.   Musculoskeletal: Negative.   Skin: Negative.   Neurological: Negative.   Endo/Heme/Allergies: Negative.   Psychiatric/Behavioral: Negative.     Objective   Vitals:   05/25/18 0958  BP: 132/88  Pulse: 82  Resp: 18  Temp: (!) 97.5 F (36.4 C)    Physical Exam Vitals signs reviewed.  Constitutional:      Appearance: Normal appearance. He is not ill-appearing.  HENT:     Head: Normocephalic and atraumatic.  Cardiovascular:     Rate and Rhythm: Normal rate and regular rhythm.     Heart sounds: Normal heart sounds. No murmur. No gallop.   Pulmonary:     Effort: Pulmonary effort is normal. No respiratory distress.   Breath sounds: Normal breath sounds. No stridor. No wheezing, rhonchi or rales.  Abdominal:     General: Abdomen is flat. Bowel sounds are normal. There is no distension.     Palpations: Abdomen is soft.     Tenderness: There is no abdominal tenderness. There is no guarding or rebound.     Hernia: A hernia is present.     Comments: Easily reducible right inguinal hernia noted  Genitourinary:    Comments: Genitourinary examination within normal limits Skin:    General: Skin is warm and dry.  Neurological:     Mental Status: He is alert and oriented to person, place, and time.  Psychiatric:        Mood and Affect: Mood normal.        Behavior: Behavior normal.        Thought Content: Thought content normal.        Judgment: Judgment normal.   Primary care notes reviewed  Assessment   Right inguinal hernia Plan   Patient is scheduled for right inguinal herniorrhaphy with mesh on 06/05/2018.  The risks and benefits of the procedure including bleeding, infection, mesh use, and the possibility of recurrence of the hernia were fully explained to the patient, who gave informed consent.

## 2018-05-31 ENCOUNTER — Encounter (HOSPITAL_COMMUNITY)
Admission: RE | Admit: 2018-05-31 | Discharge: 2018-05-31 | Disposition: A | Discharge status: Home / Self Care | Payer: BC Managed Care – PPO | Source: Ambulatory Visit | Attending: General Surgery | Admitting: General Surgery

## 2018-05-31 ENCOUNTER — Encounter (HOSPITAL_COMMUNITY): Payer: Self-pay

## 2018-06-02 ENCOUNTER — Encounter (HOSPITAL_COMMUNITY): Payer: Self-pay | Admitting: *Deleted

## 2018-06-05 ENCOUNTER — Encounter (HOSPITAL_COMMUNITY): Payer: Self-pay | Admitting: *Deleted

## 2018-06-05 ENCOUNTER — Ambulatory Visit (HOSPITAL_COMMUNITY): Payer: BC Managed Care – PPO | Admitting: Anesthesiology

## 2018-06-05 ENCOUNTER — Ambulatory Visit (HOSPITAL_COMMUNITY)
Admission: RE | Admit: 2018-06-05 | Discharge: 2018-06-05 | Disposition: A | Discharge status: Home / Self Care | Payer: BC Managed Care – PPO | Attending: General Surgery | Admitting: General Surgery

## 2018-06-05 ENCOUNTER — Encounter (HOSPITAL_COMMUNITY)
Admission: RE | Disposition: A | Discharge status: Home / Self Care | Payer: Self-pay | Source: Home / Self Care | Attending: General Surgery

## 2018-06-05 DIAGNOSIS — K4091 Unilateral inguinal hernia, without obstruction or gangrene, recurrent: Secondary | ICD-10-CM

## 2018-06-05 HISTORY — PX: INGUINAL HERNIA REPAIR: SHX194

## 2018-06-05 SURGERY — REPAIR, HERNIA, INGUINAL, ADULT
Anesthesia: General | Site: Inguinal | Laterality: Right

## 2018-06-05 MED ORDER — SCOPOLAMINE 1 MG/3DAYS TD PT72
1.0000 | MEDICATED_PATCH | TRANSDERMAL | Status: DC
  Administered 2018-06-05: 1.5 mg via TRANSDERMAL

## 2018-06-05 MED ORDER — DEXAMETHASONE SODIUM PHOSPHATE 4 MG/ML IJ SOLN
INTRAMUSCULAR | Status: DC | PRN
  Administered 2018-06-05: 10 mg via INTRAVENOUS

## 2018-06-05 MED ORDER — PROMETHAZINE HCL 25 MG/ML IJ SOLN: 6.2500 mg | INTRAMUSCULAR | Status: DC | PRN

## 2018-06-05 MED ORDER — OXYCODONE-ACETAMINOPHEN 7.5-325 MG PO TABS: 1.0000 | ORAL_TABLET | Freq: Four times a day (QID) | ORAL | 0 refills | Status: DC | PRN

## 2018-06-05 MED ORDER — KETOROLAC TROMETHAMINE 30 MG/ML IJ SOLN
30.0000 mg | Freq: Once | INTRAMUSCULAR | Status: AC
  Administered 2018-06-05: 30 mg via INTRAVENOUS
  Filled 2018-06-05: qty 1

## 2018-06-05 MED ORDER — ONDANSETRON HCL 4 MG/2ML IJ SOLN
INTRAMUSCULAR | Status: DC | PRN
  Administered 2018-06-05: 4 mg via INTRAVENOUS

## 2018-06-05 MED ORDER — PROPOFOL 10 MG/ML IV BOLUS
INTRAVENOUS | Status: AC
  Filled 2018-06-05: qty 20

## 2018-06-05 MED ORDER — SCOPOLAMINE 1 MG/3DAYS TD PT72
MEDICATED_PATCH | TRANSDERMAL | Status: AC
  Filled 2018-06-05: qty 1

## 2018-06-05 MED ORDER — LACTATED RINGERS IV SOLN
INTRAVENOUS | Status: DC
  Administered 2018-06-05: 1000 mL via INTRAVENOUS

## 2018-06-05 MED ORDER — MIDAZOLAM HCL 2 MG/2ML IJ SOLN: 0.5000 mg | Freq: Once | INTRAMUSCULAR | Status: DC | PRN

## 2018-06-05 MED ORDER — BUPIVACAINE LIPOSOME 1.3 % IJ SUSP
INTRAMUSCULAR | Status: DC | PRN
  Administered 2018-06-05: 20 mL

## 2018-06-05 MED ORDER — HYDROCODONE-ACETAMINOPHEN 7.5-325 MG PO TABS
1.0000 | ORAL_TABLET | Freq: Once | ORAL | Status: AC | PRN
  Administered 2018-06-05: 1 via ORAL
  Filled 2018-06-05: qty 1

## 2018-06-05 MED ORDER — DEXAMETHASONE SODIUM PHOSPHATE 10 MG/ML IJ SOLN
INTRAMUSCULAR | Status: AC
  Filled 2018-06-05: qty 1

## 2018-06-05 MED ORDER — MIDAZOLAM HCL 5 MG/5ML IJ SOLN
INTRAMUSCULAR | Status: DC | PRN
  Administered 2018-06-05: 2 mg via INTRAVENOUS

## 2018-06-05 MED ORDER — MIDAZOLAM HCL 2 MG/2ML IJ SOLN
INTRAMUSCULAR | Status: AC
  Filled 2018-06-05: qty 2

## 2018-06-05 MED ORDER — BUPIVACAINE LIPOSOME 1.3 % IJ SUSP
INTRAMUSCULAR | Status: AC
  Filled 2018-06-05: qty 20

## 2018-06-05 MED ORDER — CHLORHEXIDINE GLUCONATE CLOTH 2 % EX PADS: 6.0000 | MEDICATED_PAD | Freq: Once | CUTANEOUS | Status: DC

## 2018-06-05 MED ORDER — FENTANYL CITRATE (PF) 100 MCG/2ML IJ SOLN
INTRAMUSCULAR | Status: DC | PRN
  Administered 2018-06-05 (×2): 25 ug via INTRAVENOUS
  Administered 2018-06-05 (×2): 50 ug via INTRAVENOUS

## 2018-06-05 MED ORDER — GLYCOPYRROLATE PF 0.2 MG/ML IJ SOSY
PREFILLED_SYRINGE | INTRAMUSCULAR | Status: DC | PRN
  Administered 2018-06-05: .2 mg via INTRAVENOUS

## 2018-06-05 MED ORDER — PROPOFOL 10 MG/ML IV BOLUS
INTRAVENOUS | Status: AC
  Filled 2018-06-05: qty 40

## 2018-06-05 MED ORDER — CEFAZOLIN SODIUM-DEXTROSE 2-4 GM/100ML-% IV SOLN
2.0000 g | INTRAVENOUS | Status: AC
  Administered 2018-06-05: 2 g via INTRAVENOUS
  Filled 2018-06-05: qty 100

## 2018-06-05 MED ORDER — HYDROMORPHONE HCL 1 MG/ML IJ SOLN
0.2500 mg | INTRAMUSCULAR | Status: DC | PRN
  Administered 2018-06-05 (×4): 0.5 mg via INTRAVENOUS
  Filled 2018-06-05 (×4): qty 0.5

## 2018-06-05 MED ORDER — PROPOFOL 10 MG/ML IV BOLUS
INTRAVENOUS | Status: DC | PRN
  Administered 2018-06-05: 50 mg via INTRAVENOUS
  Administered 2018-06-05: 150 mg via INTRAVENOUS
  Administered 2018-06-05: 50 mg via INTRAVENOUS

## 2018-06-05 MED ORDER — FENTANYL CITRATE (PF) 250 MCG/5ML IJ SOLN
INTRAMUSCULAR | Status: AC
  Filled 2018-06-05: qty 5

## 2018-06-05 MED ORDER — 0.9 % SODIUM CHLORIDE (POUR BTL) OPTIME
TOPICAL | Status: DC | PRN
  Administered 2018-06-05: 1000 mL

## 2018-06-05 MED ORDER — ONDANSETRON HCL 4 MG/2ML IJ SOLN
INTRAMUSCULAR | Status: AC
  Filled 2018-06-05: qty 2

## 2018-06-05 SURGICAL SUPPLY — 36 items
ADH SKN CLS APL DERMABOND .7 (GAUZE/BANDAGES/DRESSINGS) ×1
CLOTH BEACON ORANGE TIMEOUT ST (SAFETY) ×3 IMPLANT
COVER LIGHT HANDLE STERIS (MISCELLANEOUS) ×6 IMPLANT
COVER WAND RF STERILE (DRAPES) ×2 IMPLANT
DERMABOND ADVANCED (GAUZE/BANDAGES/DRESSINGS) ×2
DERMABOND ADVANCED .7 DNX12 (GAUZE/BANDAGES/DRESSINGS) ×1 IMPLANT
DRAIN PENROSE 18X1/2 LTX STRL (DRAIN) ×3 IMPLANT
ELECT REM PT RETURN 9FT ADLT (ELECTROSURGICAL) ×3
ELECTRODE REM PT RTRN 9FT ADLT (ELECTROSURGICAL) ×1 IMPLANT
GAUZE SPONGE 4X4 12PLY STRL (GAUZE/BANDAGES/DRESSINGS) ×3 IMPLANT
GLOVE BIOGEL PI IND STRL 7.0 (GLOVE) ×2 IMPLANT
GLOVE BIOGEL PI INDICATOR 7.0 (GLOVE) ×4
GLOVE SURG SS PI 7.5 STRL IVOR (GLOVE) ×3 IMPLANT
GOWN STRL REUS W/ TWL XL LVL3 (GOWN DISPOSABLE) ×1 IMPLANT
GOWN STRL REUS W/TWL LRG LVL3 (GOWN DISPOSABLE) ×4 IMPLANT
GOWN STRL REUS W/TWL XL LVL3 (GOWN DISPOSABLE) ×3
INST SET MINOR GENERAL (KITS) ×3 IMPLANT
KIT TURNOVER KIT A (KITS) ×3 IMPLANT
MANIFOLD NEPTUNE II (INSTRUMENTS) ×3 IMPLANT
MESH HERNIA 1.6X1.9 PLUG LRG (Mesh General) IMPLANT
MESH HERNIA PLUG LRG (Mesh General) ×2 IMPLANT
NDL HYPO 21X1.5 SAFETY (NEEDLE) ×1 IMPLANT
NEEDLE HYPO 21X1.5 SAFETY (NEEDLE) ×3 IMPLANT
NS IRRIG 1000ML POUR BTL (IV SOLUTION) ×3 IMPLANT
PACK MINOR (CUSTOM PROCEDURE TRAY) ×3 IMPLANT
PAD ARMBOARD 7.5X6 YLW CONV (MISCELLANEOUS) ×3 IMPLANT
SET BASIN LINEN APH (SET/KITS/TRAYS/PACK) ×3 IMPLANT
SOL PREP PROV IODINE SCRUB 4OZ (MISCELLANEOUS) ×3 IMPLANT
SUT MNCRL AB 4-0 PS2 18 (SUTURE) ×3 IMPLANT
SUT NOVA NAB GS-22 2 2-0 T-19 (SUTURE) ×8 IMPLANT
SUT VIC AB 2-0 CT1 27 (SUTURE) ×3
SUT VIC AB 2-0 CT1 TAPERPNT 27 (SUTURE) ×1 IMPLANT
SUT VIC AB 3-0 SH 27 (SUTURE) ×3
SUT VIC AB 3-0 SH 27X BRD (SUTURE) ×1 IMPLANT
SUT VICRYL AB 3 0 TIES (SUTURE) ×2 IMPLANT
SYR 20CC LL (SYRINGE) ×3 IMPLANT

## 2018-06-05 NOTE — Transfer of Care (Signed)
Immediate Anesthesia Transfer of Care Note  Patient: Paul Watson  Procedure(s) Performed: HERNIA REPAIR INGUINAL ADULT WITH MESH (Right Inguinal)  Patient Location: PACU  Anesthesia Type:General  Level of Consciousness: awake, alert , oriented and patient cooperative  Airway & Oxygen Therapy: Patient Spontanous Breathing  Post-op Assessment: Report given to RN and Post -op Vital signs reviewed and stable  Post vital signs: Reviewed and stable  Last Vitals:  Vitals Value Taken Time  BP 120/75 06/05/2018  8:47 AM  Temp    Pulse 52 06/05/2018  8:48 AM  Resp 17 06/05/2018  8:49 AM  SpO2 80 % 06/05/2018  8:48 AM  Vitals shown include unvalidated device data.  Last Pain:  Vitals:   06/05/18 0653  TempSrc: Oral  PainSc: 0-No pain      Patients Stated Pain Goal: 9 (20/72/18 2883)  Complications: No apparent anesthesia complications

## 2018-06-05 NOTE — Anesthesia Procedure Notes (Signed)
Procedure Name: LMA Insertion Date/Time: 06/05/2018 7:34 AM Performed by: Andree Elk, Amy A, CRNA Pre-anesthesia Checklist: Patient identified, Emergency Drugs available, Suction available, Patient being monitored and Timeout performed Patient Re-evaluated:Patient Re-evaluated prior to induction Oxygen Delivery Method: Circle system utilized Preoxygenation: Pre-oxygenation with 100% oxygen Induction Type: IV induction Ventilation: Mask ventilation without difficulty LMA: LMA inserted LMA Size: 4.0 Number of attempts: 1 Placement Confirmation: positive ETCO2 and breath sounds checked- equal and bilateral Tube secured with: Tape Dental Injury: Teeth and Oropharynx as per pre-operative assessment

## 2018-06-05 NOTE — Interval H&P Note (Signed)
History and Physical Interval Note:  06/05/2018 7:12 AM  Paul Watson  has presented today for surgery, with the diagnosis of right inguinal hernia  The various methods of treatment have been discussed with the patient and family. After consideration of risks, benefits and other options for treatment, the patient has consented to  Procedure(s): HERNIA REPAIR INGUINAL ADULT WITH MESH (Right) as a surgical intervention .  The patient's history has been reviewed, patient examined, no change in status, stable for surgery.  I have reviewed the patient's chart and labs.  Questions were answered to the patient's satisfaction.     Aviva Signs

## 2018-06-05 NOTE — Anesthesia Preprocedure Evaluation (Signed)
Anesthesia Evaluation  Patient identified by MRN, date of birth, ID band Patient awake    Reviewed: Allergy & Precautions, NPO status , Patient's Chart, lab work & pertinent test results  Airway Mallampati: II  TM Distance: >3 FB Neck ROM: Full    Dental no notable dental hx. (+) Teeth Intact   Pulmonary neg pulmonary ROS,    Pulmonary exam normal breath sounds clear to auscultation       Cardiovascular Exercise Tolerance: Good negative cardio ROS Normal cardiovascular examI Rhythm:Regular Rate:Normal     Neuro/Psych negative neurological ROS  negative psych ROS   GI/Hepatic negative GI ROS, Neg liver ROS,   Endo/Other  negative endocrine ROS  Renal/GU negative Renal ROS  negative genitourinary   Musculoskeletal negative musculoskeletal ROS (+)   Abdominal   Peds negative pediatric ROS (+)  Hematology negative hematology ROS (+)   Anesthesia Other Findings   Reproductive/Obstetrics negative OB ROS                             Anesthesia Physical Anesthesia Plan  ASA: I  Anesthesia Plan: General   Post-op Pain Management:    Induction: Intravenous  PONV Risk Score and Plan:   Airway Management Planned: LMA  Additional Equipment:   Intra-op Plan:   Post-operative Plan: Extubation in OR  Informed Consent: I have reviewed the patients History and Physical, chart, labs and discussed the procedure including the risks, benefits and alternatives for the proposed anesthesia with the patient or authorized representative who has indicated his/her understanding and acceptance.   Dental advisory given  Plan Discussed with: CRNA  Anesthesia Plan Comments: (LMA vs ETT)        Anesthesia Quick Evaluation

## 2018-06-05 NOTE — Discharge Instructions (Signed)
Open Hernia Repair, Adult, Care After °This sheet gives you information about how to care for yourself after your procedure. Your health care provider may also give you more specific instructions. If you have problems or questions, contact your health care provider. °What can I expect after the procedure? °After the procedure, it is common to have: °· Mild discomfort. °· Slight bruising. °· Minor swelling. °· Pain in the abdomen. °Follow these instructions at home: °Incision care ° °· Follow instructions from your health care provider about how to take care of your incision area. Make sure you: °? Wash your hands with soap and water before you change your bandage (dressing). If soap and water are not available, use hand sanitizer. °? Change your dressing as told by your health care provider. °? Leave stitches (sutures), skin glue, or adhesive strips in place. These skin closures may need to stay in place for 2 weeks or longer. If adhesive strip edges start to loosen and curl up, you may trim the loose edges. Do not remove adhesive strips completely unless your health care provider tells you to do that. °· Check your incision area every day for signs of infection. Check for: °? More redness, swelling, or pain. °? More fluid or blood. °? Warmth. °? Pus or a bad smell. °Activity °· Do not drive or use heavy machinery while taking prescription pain medicine. Do not drive until your health care provider approves. °· Until your health care provider approves: °? Do not lift anything that is heavier than 10 lb (4.5 kg). °? Do not play contact sports. °· Return to your normal activities as told by your health care provider. Ask your health care provider what activities are safe. °General instructions °· To prevent or treat constipation while you are taking prescription pain medicine, your health care provider may recommend that you: °? Drink enough fluid to keep your urine clear or pale yellow. °? Take over-the-counter or  prescription medicines. °? Eat foods that are high in fiber, such as fresh fruits and vegetables, whole grains, and beans. °? Limit foods that are high in fat and processed sugars, such as fried and sweet foods. °· Take over-the-counter and prescription medicines only as told by your health care provider. °· Do not take tub baths or go swimming until your health care provider approves. °· Keep all follow-up visits as told by your health care provider. This is important. °Contact a health care provider if: °· You develop a rash. °· You have more redness, swelling, or pain around your incision. °· You have more fluid or blood coming from your incision. °· Your incision feels warm to the touch. °· You have pus or a bad smell coming from your incision. °· You have a fever or chills. °· You have blood in your stool (feces). °· You have not had a bowel movement in 2-3 days. °· Your pain is not controlled with medicine. °Get help right away if: °· You have chest pain or shortness of breath. °· You feel light-headed or feel faint. °· You have severe pain. °· You vomit and your pain is worse. °This information is not intended to replace advice given to you by your health care provider. Make sure you discuss any questions you have with your health care provider. °Document Released: 12/18/2004 Document Revised: 12/19/2015 Document Reviewed: 11/12/2015 °Elsevier Interactive Patient Education © 2019 [employer] ° °General Anesthesia, Adult, Care After °This sheet gives you information about how to care for yourself after   your procedure. Your health care provider may also give you more specific instructions. If you have problems or questions, contact your health care provider. °What can I expect after the procedure? °After the procedure, the following side effects are common: °· Pain or discomfort at the IV site. °· Nausea. °· Vomiting. °· Sore throat. °· Trouble concentrating. °· Feeling cold or chills. °· Weak or  tired. °· Sleepiness and fatigue. °· Soreness and body aches. These side effects can affect parts of the body that were not involved in surgery. °Follow these instructions at home: ° °For at least 24 hours after the procedure: °· Have a responsible adult stay with you. It is important to have someone help care for you until you are awake and alert. °· Rest as needed. °· Do not: °? Participate in activities in which you could fall or become injured. °? Drive. °? Use heavy machinery. °? Drink alcohol. °? Take sleeping pills or medicines that cause drowsiness. °? Make important decisions or sign legal documents. °? Take care of children on your own. °Eating and drinking °· Follow any instructions from your health care provider about eating or drinking restrictions. °· When you feel hungry, start by eating small amounts of foods that are soft and easy to digest (bland), such as toast. Gradually return to your regular diet. °· Drink enough fluid to keep your urine pale yellow. °· If you vomit, rehydrate by drinking water, juice, or clear broth. °General instructions °· If you have sleep apnea, surgery and certain medicines can increase your risk for breathing problems. Follow instructions from your health care provider about wearing your sleep device: °? Anytime you are sleeping, including during daytime naps. °? While taking prescription pain medicines, sleeping medicines, or medicines that make you drowsy. °· Return to your normal activities as told by your health care provider. Ask your health care provider what activities are safe for you. °· Take over-the-counter and prescription medicines only as told by your health care provider. °· If you smoke, do not smoke without supervision. °· Keep all follow-up visits as told by your health care provider. This is important. °Contact a health care provider if: °· You have nausea or vomiting that does not get better with medicine. °· You cannot eat or drink without  vomiting. °· You have pain that does not get better with medicine. °· You are unable to pass urine. °· You develop a skin rash. °· You have a fever. °· You have redness around your IV site that gets worse. °Get help right away if: °· You have difficulty breathing. °· You have chest pain. °· You have blood in your urine or stool, or you vomit blood. °Summary °· After the procedure, it is common to have a sore throat or nausea. It is also common to feel tired. °· Have a responsible adult stay with you for the first 24 hours after general anesthesia. It is important to have someone help care for you until you are awake and alert. °· When you feel hungry, start by eating small amounts of foods that are soft and easy to digest (bland), such as toast. Gradually return to your regular diet. °· Drink enough fluid to keep your urine pale yellow. °· Return to your normal activities as told by your health care provider. Ask your health care provider what activities are safe for you. °This information is not intended to replace advice given to you by your health care provider. Make sure you discuss   any questions you have with your health care provider. °Document Released: 09/06/2000 Document Revised: 01/14/2017 Document Reviewed: 01/14/2017 °Elsevier Interactive Patient Education © 2019 [employer] ° ° °

## 2018-06-05 NOTE — Op Note (Signed)
Patient:  Paul Watson  DOB:  24-Jan-1986  MRN:  [id number]   Preop Diagnosis: Recurrent right inguinal hernia  Postop Diagnosis: Same  Procedure: Recurrent right inguinal herniorrhaphy with mesh  Surgeon: Aviva Signs, MD  Anes: General  Indications: Patient is a 32 year old white male who presents with a recurrent right inguinal hernia.  The risks and benefits of the procedure including bleeding, infection, mesh use, and the possibility of recurrence of the hernia were fully explained to the patient, who gave informed consent.  Procedure note: The patient was placed in supine position.  After general anesthesia was administered, the right groin region was prepped and draped using the usual sterile technique with Betadine.  Surgical site confirmation was performed.  An incision was made in the right groin region down to the external oblique aponeurosis.  The aponeurosis was incised to the external ring.  A Penrose drain was placed around the spermatic cord.  The vase deferens was noted within the spermatic cord.  A direct hernia was found.  Any scar tissue was sharply divided in order to expose the aponeurosis.  The ileoinguinal nerve was identified.  The direct hernia was incised at its base and inverted.  A large Bard PerFix plug was then inserted and secured to the transversalis fascia using 2-0 Novafil interrupted sutures.  An onlay patch was then placed along the floor of the inguinal canal and secured superiorly to the conjoined tendon and inferiorly to the shelving edge of Poupart's ligament using 2-0 Novafil interrupted sutures.  The internal ring was re-created using a 2-0 Novafil interrupted suture.  The external oblique aponeurosis was reapproximated using a 2-0 Vicryl running suture.  The subcutaneous layer was reapproximated using 3-0 Vicryl interrupted sutures.  Exparel was instilled into the surrounding wound.  The skin was closed using a 4-0 Monocryl subcuticular suture.   Dermabond was applied.  All tape and needle counts were correct at the end of the procedure.  The patient was awakened and transferred to PACU in stable condition.  Complications: None  EBL: Minimal  Specimen: None

## 2018-06-05 NOTE — Progress Notes (Signed)
Pt became pale and diaphoretic. VS  Obtained, pulse 80 and vs 105/70 and 02 90% with resting. Pt placed on recliner and 6L [place] applied. Wife at bedside.

## 2018-06-05 NOTE — Anesthesia Postprocedure Evaluation (Signed)
Anesthesia Post Note  Patient: Paul Watson  Procedure(s) Performed: HERNIA REPAIR INGUINAL ADULT WITH MESH (Right Inguinal)  Patient location during evaluation: PACU Anesthesia Type: General Level of consciousness: awake and alert and oriented Pain management: pain level controlled Vital Signs Assessment: post-procedure vital signs reviewed and stable Respiratory status: spontaneous breathing Cardiovascular status: stable Postop Assessment: no apparent nausea or vomiting Anesthetic complications: no     Last Vitals:  Vitals:   06/05/18 0900 06/05/18 0915  BP: 127/76 131/85  Pulse: 78 81  Resp: 17 14  Temp:    SpO2: 100% 98%    Last Pain:  Vitals:   06/05/18 0915  TempSrc:   PainSc: 6                  Laini Urick A

## 2018-06-05 NOTE — Progress Notes (Signed)
Pt color returned and nondiaphoretic. Pt states he feels better

## 2018-06-06 ENCOUNTER — Encounter (HOSPITAL_COMMUNITY): Payer: Self-pay | Admitting: General Surgery

## 2018-06-15 ENCOUNTER — Encounter: Payer: Self-pay | Admitting: General Surgery

## 2018-06-15 ENCOUNTER — Ambulatory Visit (INDEPENDENT_AMBULATORY_CARE_PROVIDER_SITE_OTHER): Payer: Self-pay | Admitting: General Surgery

## 2018-06-15 ENCOUNTER — Encounter (INDEPENDENT_AMBULATORY_CARE_PROVIDER_SITE_OTHER): Payer: Self-pay

## 2018-06-15 VITALS — BP 133/82 | HR 71 | Temp 96.9°F | Resp 18 | Wt 209.3 lb

## 2018-06-15 DIAGNOSIS — Z09 Encounter for follow-up examination after completed treatment for conditions other than malignant neoplasm: Secondary | ICD-10-CM

## 2018-06-15 NOTE — Progress Notes (Signed)
Subjective:     Paul Watson  Here for follow-up status post radial herniorrhaphy with mesh.  Patient doing well.  He is off his pain medications.  He is increasing his activity as able. Objective:    BP 133/82 (BP Location: Left Arm, Patient Position: Sitting, Cuff Size: Normal)   Pulse 71   Temp (!) 96.9 F (36.1 C) (Temporal)   Resp 18   Wt 209 lb 4.8 oz (94.9 kg)   BMI 28.39 kg/m   General:  alert, cooperative and no distress  Abdomen soft, incision healing well.     Assessment:    Doing well postoperatively.    Plan:   Increase activity as able.  Follow-up here as needed.  May return to work without restrictions next week.

## 2018-11-20 DIAGNOSIS — L858 Other specified epidermal thickening: Secondary | ICD-10-CM | POA: Diagnosis not present

## 2018-11-20 DIAGNOSIS — D235 Other benign neoplasm of skin of trunk: Secondary | ICD-10-CM | POA: Diagnosis not present

## 2018-11-20 DIAGNOSIS — L818 Other specified disorders of pigmentation: Secondary | ICD-10-CM | POA: Diagnosis not present

## 2019-05-07 DIAGNOSIS — S39012A Strain of muscle, fascia and tendon of lower back, initial encounter: Secondary | ICD-10-CM | POA: Diagnosis not present

## 2019-07-16 ENCOUNTER — Encounter: Payer: Self-pay | Admitting: Family Medicine

## 2019-11-20 DIAGNOSIS — D239 Other benign neoplasm of skin, unspecified: Secondary | ICD-10-CM | POA: Diagnosis not present

## 2019-11-20 DIAGNOSIS — B070 Plantar wart: Secondary | ICD-10-CM | POA: Diagnosis not present

## 2019-12-03 DIAGNOSIS — B079 Viral wart, unspecified: Secondary | ICD-10-CM | POA: Diagnosis not present

## 2020-05-02 ENCOUNTER — Ambulatory Visit (INDEPENDENT_AMBULATORY_CARE_PROVIDER_SITE_OTHER): Payer: BC Managed Care – PPO | Admitting: Podiatry

## 2020-05-02 ENCOUNTER — Other Ambulatory Visit: Payer: Self-pay

## 2020-05-02 DIAGNOSIS — B070 Plantar wart: Secondary | ICD-10-CM | POA: Diagnosis not present

## 2020-05-02 DIAGNOSIS — M79672 Pain in left foot: Secondary | ICD-10-CM | POA: Diagnosis not present

## 2020-05-13 NOTE — Progress Notes (Signed)
  Subjective:  Patient ID: Paul Watson, male    DOB: 14-Nov-1985,  MRN: [id number]  Chief Complaint  Patient presents with  . Plantar Warts    Left sub 1st plantar wart. Pt states has had treatment at dermatology, has had cryotherapy and tried OTC medication with no success. Pt states it is painful.    34 y.o. male presents with the above complaint. History confirmed with patient.   Objective:  Physical Exam: warm, good capillary refill, no trophic changes or ulcerative lesions, normal DP and PT pulses and normal sensory exam. Left Foot: Verrucous lesion to the plantar aspect of the first metatarsal nonweightbearing area.  Petechial bleeding noted upon debridement  Assessment:   1. Verruca plantaris   2. Pain in left foot      Plan:  Patient was evaluated and treated and all questions answered.  Verruca -Educated on etiology -Lesion debrided and destroyed  Procedure: Destruction of Lesion Location: left 1st met Anesthesia: none Instrumentation: 15 blade, YAG laser Technique: Debridement of lesion , followed by several passes of Yag laser to destroy lesion.  No follow-ups on file.

## 2020-06-20 ENCOUNTER — Other Ambulatory Visit: Payer: Self-pay

## 2020-06-20 ENCOUNTER — Ambulatory Visit (INDEPENDENT_AMBULATORY_CARE_PROVIDER_SITE_OTHER): Payer: BC Managed Care – PPO | Admitting: Podiatry

## 2020-06-20 DIAGNOSIS — B070 Plantar wart: Secondary | ICD-10-CM

## 2020-07-07 NOTE — Progress Notes (Signed)
  Subjective:  Patient ID: Paul Watson, male    DOB: Dec 16, 1985,  MRN: [id number]  Chief Complaint  Patient presents with  . Plantar Warts    Follow up plantar wart.    35 y.o. male presents with the above complaint. History confirmed with patient. Thinks the lesion is doing better.  Objective:  Physical Exam: warm, good capillary refill, no trophic changes or ulcerative lesions, normal DP and PT pulses and normal sensory exam. Left Foot: Verrucous lesion to the plantar aspect of the first metatarsal nonweightbearing area.  Assessment:   1. Verruca plantaris      Plan:  Patient was evaluated and treated and all questions answered.  Verruca -Lesion debrided and destroyed.  Procedure: Destruction of Lesion Location: left 1st met Anesthesia: none Instrumentation: 15 blade, YAG laser Technique: Debridement of lesion , followed by several passes of Yag laser to destroy lesion.  No follow-ups on file.

## 2020-07-15 ENCOUNTER — Ambulatory Visit (INDEPENDENT_AMBULATORY_CARE_PROVIDER_SITE_OTHER): Payer: BC Managed Care – PPO | Admitting: Podiatry

## 2020-07-15 ENCOUNTER — Other Ambulatory Visit: Payer: Self-pay

## 2020-07-15 DIAGNOSIS — B070 Plantar wart: Secondary | ICD-10-CM | POA: Diagnosis not present

## 2020-07-15 NOTE — Progress Notes (Signed)
  Subjective:  Patient ID: Paul Watson, male    DOB: 04-08-1986,  MRN: [id number]  Chief Complaint  Patient presents with  . Plantar Warts    Left foot plantar wart 1st met. Here for laser treatment    35 y.o. male presents with the above complaint. History confirmed with patient.  States the wart is doing better.  Objective:  Physical Exam: warm, good capillary refill, no trophic changes or ulcerative lesions, normal DP and PT pulses and normal sensory exam. Left Foot: Verrucous lesion to the plantar aspect of the first metatarsal nonweightbearing area.  Assessment:   1. Verruca plantaris    Plan:  Patient was evaluated and treated and all questions answered.  Verruca -Lesion debrided again and destroyed with laser  Procedure: Destruction of Lesion Location: left 1st met area Anesthesia: none Instrumentation: 15 blade, YAG laser Technique: Debridement of lesion , followed by several passes of Yag laser to destroy lesion.   Return in about 3 weeks (around 08/05/2020).

## 2020-07-29 ENCOUNTER — Ambulatory Visit (INDEPENDENT_AMBULATORY_CARE_PROVIDER_SITE_OTHER): Payer: BC Managed Care – PPO | Admitting: Podiatry

## 2020-07-29 ENCOUNTER — Other Ambulatory Visit: Payer: Self-pay

## 2020-07-29 DIAGNOSIS — B070 Plantar wart: Secondary | ICD-10-CM

## 2020-08-11 NOTE — Progress Notes (Signed)
  Subjective:  Patient ID: Loma Sender, male    DOB: 1985/08/26,  MRN: [id number]  Chief Complaint  Patient presents with  . Plantar Warts    Plantar wart of left foot. Pt. States some improvement but in between visits he has to shave the warts down himself.   35 y.o. male presents with the above complaint. History confirmed with patient.   Objective:  Physical Exam: warm, good capillary refill, no trophic changes or ulcerative lesions, normal DP and PT pulses and normal sensory exam. Left Foot: Verrucous lesion to the plantar aspect of the first metatarsal nonweightbearing area.  Assessment:   1. Verruca plantaris    Plan:  Patient was evaluated and treated and all questions answered.  Verruca -Lesion again destroyed with laser.  Patient debrided the lesion himself prior to visit  Procedure: Destruction of Lesion Location: left forefoot Anesthesia: none Instrumentation:  YAG laser Technique: Several passes of Yag laser to destroy lesion.  No follow-ups on file.

## 2020-08-19 ENCOUNTER — Other Ambulatory Visit: Payer: Self-pay

## 2020-08-19 ENCOUNTER — Ambulatory Visit (INDEPENDENT_AMBULATORY_CARE_PROVIDER_SITE_OTHER): Payer: BC Managed Care – PPO | Admitting: Podiatry

## 2020-08-19 DIAGNOSIS — B070 Plantar wart: Secondary | ICD-10-CM

## 2020-08-21 ENCOUNTER — Other Ambulatory Visit: Payer: Self-pay

## 2020-08-21 ENCOUNTER — Ambulatory Visit (INDEPENDENT_AMBULATORY_CARE_PROVIDER_SITE_OTHER): Payer: BC Managed Care – PPO | Admitting: Family Medicine

## 2020-08-21 VITALS — BP 130/86 | HR 84 | Ht 72.0 in | Wt 212.0 lb

## 2020-08-21 DIAGNOSIS — M545 Low back pain, unspecified: Secondary | ICD-10-CM | POA: Diagnosis not present

## 2020-08-21 MED ORDER — DICLOFENAC SODIUM 75 MG PO TBEC: 75.0000 mg | DELAYED_RELEASE_TABLET | Freq: Two times a day (BID) | ORAL | 0 refills | Status: DC

## 2020-08-21 MED ORDER — CYCLOBENZAPRINE HCL 10 MG PO TABS: 10.0000 mg | ORAL_TABLET | Freq: Two times a day (BID) | ORAL | 0 refills | Status: DC | PRN

## 2020-08-21 NOTE — Patient Instructions (Addendum)
Herniated Disk  A herniated disk happens when a disk in the spine bulges out too far. There is an oval disk between each pair of bones (vertebrae) in the backbone or spine. The disks connect the bones, help the spine move, and keep the bones from rubbing against each other when you move. A herniated disk can happen anywhere in the back or neck area. It most often affects the lower back. What are the causes? This condition may be caused by:  Wear and tear as you age.  Sudden injury, such as a strain or sprain. What increases the risk? The following factors may make you more likely to develop this condition:  Age. The older you are the higher the risk.  Being a man who is 69-72 years old.  Doing activities that involve heavy lifting, bending, or twisting.  Not getting enough exercise.  Being overweight.  Smoking or using tobacco. What are the signs or symptoms? Symptoms may vary depending on where the herniated disk is in your body.  Sharp pain in the arm, hip, butt, or in the lower back. The pain can spread to the leg and foot.  Dizziness.  A feeling that things are moving when they are not (vertigo).  Pain or weakness in the neck, shoulder, upper or lower arm, or fingers.  Muscle weakness. You may not be able to: ? Lift your arm or leg. ? Stand on your toes. ? Squeeze with your hands.  Loss of feeling (numbness) or tingling in the hands, arms, feet, or legs.  Being unable to control when to poop or pee. This is rare but serious. How is this treated? This condition may be treated with:  Resting for a few days or several weeks. ? Do not do things that need a lot of effort. Do not go into complete bed rest. Do only light activities. ? If you have a herniated disk in your lower back, limit how much you sit. Sitting puts more pressure on the disk.  Medicines for pain, swelling, or tense muscles.  Ice or heat therapy.  Steroid shots. These can reduce pain and  swelling.  Physical therapy to strengthen your back. Follow these instructions at home: Medicines  Take over-the-counter and prescription medicines only as told by your doctor.  If told, take steps to prevent problems with pooping (constipation). You may need to: ? Drink enough fluid to keep your pee (urine) pale yellow. ? Take over-the-counter or prescription medicines. ? Eat foods that are high in fiber. These include beans, whole grains, and fresh fruits and vegetables. ? Limit foods that are high in fat and processed sugars. These include fried or sweet foods.  Ask your doctor if you should avoid driving or using machines while you are taking your medicines. Managing pain, stiffness, and swelling  If told, put ice on the affected area. To do this: ? Put ice in a plastic bag. ? Place a towel between your skin and the bag. ? Leave the ice on for 20 minutes, 2-3 times a day.  If told, put heat on the painful area. Use the heat source that your doctor recommends, such as a moist heat pack or a heating pad. ? Place a towel between your skin and the heat source. ? Leave the heat on for 20-30 minutes. Take off the heat or ice if your skin turns bright red. If you cannot feel pain, heat, or cold, you have a greater risk of getting burned.  Activity  Rest as told by your doctor. Avoid strict bed rest. Do only activities that do not cause pain.  After your rest period: ? Return to your normal activities as told by your doctor. Slowly start doing exercises as told. Ask your doctor what activities and exercises are safe for you. ? Use good posture. ? Avoid movements that cause pain. ? Do not lift anything that is heavier than 10 lb (4.5 kg), or the limit that you are told. ? Do not sit or stand for a long time without moving. ? Do not sit for a long time without getting up and moving around.  If exercises (physical therapy) were prescribed, do them as told by your doctor.  Try  to strengthen your back and belly with exercises such as swimming or walking. General instructions  Do not smoke or use any products that contain nicotine or tobacco. If you need help quitting, ask your doctor.  Do not wear high-heeled shoes.  Do not sleep on your belly.  If you are overweight, work with your doctor to lose weight safely.  Keep all follow-up visits. How is this prevented?  Stay at a healthy weight.  Stay in shape. Do at least 150 minutes of moderate-intensity exercise each week, such as fast walking or water aerobics.  When lifting objects: ? Keep your feet as far apart as your shoulders or farther apart. ? Tighten your belly muscles. ? Bend your knees and hips, and keep your spine neutral. Lift using the strength of your legs, not your back. Do not lock your knees straight out. ? Always ask for help to lift heavy or large objects. Contact a doctor if:  You have back pain or neck pain that does not get better after 6 weeks.  You have very bad pain in your back, neck, legs, or arms.  You get any of these problems in any part of your body: ? Tingling. ? Weakness. ? Loss of feeling. Get help right away if:  You cannot move your arms or legs.  You cannot control when you pee or poop.  You feel dizzy.  You faint.  You have trouble breathing. These symptoms may be an emergency. Get help right away. Call your local emergency services (911 in the [place]  Do not wait to see if the symptoms will go away.  Do not drive yourself to the hospital. Summary  A herniated disk happens when a disk in your backbone bulges out too far.  This condition may be caused by wear and tear as you age or a sudden injury.  Symptoms may vary depending on where your herniated disk is in your body.  Treatment may include rest, medicines, ice or heat therapy, steroid shots, and exercises. This information is not intended to replace advice given to you by your health care  provider. Make sure you discuss any questions you have with your health care provider. Document Revised: 09/19/2019 Document Reviewed: 09/19/2019 Elsevier Patient Education  2021 [employer].   Acute Back Pain, Adult Acute back pain is sudden and usually short-lived. It is often caused by an injury to the muscles and tissues in the back. The injury may result from:  A muscle or ligament getting overstretched or torn (strained). Ligaments are tissues that connect bones to each other. Lifting something improperly can cause a back strain.  Wear and tear (degeneration) of the spinal disks. Spinal disks are circular tissue that provide cushioning between the bones of the spine (  vertebrae).  Twisting motions, such as while playing sports or doing yard work.  A hit to the back.  Arthritis. You may have a physical exam, lab tests, and imaging tests to find the cause of your pain. Acute back pain usually goes away with rest and home care. Follow these instructions at home: Managing pain, stiffness, and swelling  Treatment may include medicines for pain and inflammation that are taken by mouth or applied to the skin, prescription pain medicine, or muscle relaxants. Take over-the-counter and prescription medicines only as told by your health care provider.  Your health care provider may recommend applying ice during the first 24-48 hours after your pain starts. To do this: ? Put ice in a plastic bag. ? Place a towel between your skin and the bag. ? Leave the ice on for 20 minutes, 2-3 times a day.  If directed, apply heat to the affected area as often as told by your health care provider. Use the heat source that your health care provider recommends, such as a moist heat pack or a heating pad. ? Place a towel between your skin and the heat source. ? Leave the heat on for 20-30 minutes. ? Remove the heat if your skin turns bright red. This is especially important if you are unable to feel pain,  heat, or cold. You have a greater risk of getting burned. Activity  Do not stay in bed. Staying in bed for more than 1-2 days can delay your recovery.  Sit up and stand up straight. Avoid leaning forward when you sit or hunching over when you stand. ? If you work at a desk, sit close to it so you do not need to lean over. Keep your chin tucked in. Keep your neck drawn back, and keep your elbows bent at a 90-degree angle (right angle). ? Sit high and close to the steering wheel when you drive. Add lower back (lumbar) support to your car seat, if needed.  Take short walks on even surfaces as soon as you are able. Try to increase the length of time you walk each day.  Do not sit, drive, or stand in one place for more than 30 minutes at a time. Sitting or standing for long periods of time can put stress on your back.  Do not drive or use heavy machinery while taking prescription pain medicine.  Use proper lifting techniques. When you bend and lift, use positions that put less stress on your back: ? [place] your knees. ? Keep the load close to your body. ? Avoid twisting.  Exercise regularly as told by your health care provider. Exercising helps your back heal faster and helps prevent back injuries by keeping muscles strong and flexible.  Work with a physical therapist to make a safe exercise program, as recommended by your health care provider. Do any exercises as told by your physical therapist.   Lifestyle  Maintain a healthy weight. Extra weight puts stress on your back and makes it difficult to have good posture.  Avoid activities or situations that make you feel anxious or stressed. Stress and anxiety increase muscle tension and can make back pain worse. Learn ways to manage anxiety and stress, such as through exercise. General instructions  Sleep on a firm mattress in a comfortable position. Try lying on your side with your knees slightly bent. If you lie on your back, put a pillow under  your knees.  Follow your treatment plan as told by your health  care provider. This may include: ? Cognitive or behavioral therapy. ? Acupuncture or massage therapy. ? Meditation or yoga. Contact a health care provider if:  You have pain that is not relieved with rest or medicine.  You have increasing pain going down into your legs or buttocks.  Your pain does not improve after 2 weeks.  You have pain at night.  You lose weight without trying.  You have a fever or chills. Get help right away if:  You develop new bowel or bladder control problems.  You have unusual weakness or numbness in your arms or legs.  You develop nausea or vomiting.  You develop abdominal pain.  You feel faint. Summary  Acute back pain is sudden and usually short-lived.  Use proper lifting techniques. When you bend and lift, use positions that put less stress on your back.  Take over-the-counter and prescription medicines and apply heat or ice as directed by your health care provider. This information is not intended to replace advice given to you by your health care provider. Make sure you discuss any questions you have with your health care provider. Document Revised: 02/22/2020 Document Reviewed: 02/22/2020 Elsevier Patient Education  2021 [employer].

## 2020-08-21 NOTE — Progress Notes (Signed)
Patient ID: Paul Watson, male    DOB: [birth year], 35 y.o.   MRN: [id number]   Chief Complaint  Patient presents with  . Back Pain   Subjective:    HPI  CC-  low back pain Has been going on for about one week ago. Pain on lower left side.   Recurring.  Going on for 1-2 yrs.  Laying down and feeling to stretch it.  Worse with standing. Radiates down to buttock.  Not down the leg.  No blood in urine, urgency or frequency. No fever.  No injury or fall or trauma to back. Picking up car seat and other child with the other arm flared it up. meds- ibuprofen 400mg  only  A couple times per week. Today pain scale- 5/10 at worse.  Then it's 2/10.  Has been off work for parental binding time.  Will go back to lots of bending at times.  WorkTechnical brewer and working on Actor.   Medical History Gohan has a past medical history of Fractures.   Outpatient Encounter Medications as of 08/21/2020  Medication Sig  . cyclobenzaprine (FLEXERIL) 10 MG tablet Take 1 tablet (10 mg total) by mouth 2 (two) times daily as needed for muscle spasms.  . diclofenac (VOLTAREN) 75 MG EC tablet Take 1 tablet (75 mg total) by mouth 2 (two) times daily.   No facility-administered encounter medications on file as of 08/21/2020.     Review of Systems  Constitutional: Negative for chills and fever.  HENT: Negative for congestion, rhinorrhea and sore throat.   Respiratory: Negative for cough, shortness of breath and wheezing.   Cardiovascular: Negative for chest pain and leg swelling.  Gastrointestinal: Negative for abdominal pain, diarrhea, nausea and vomiting.  Genitourinary: Negative for difficulty urinating, dysuria, frequency, hematuria and urgency.  Musculoskeletal: Positive for back pain.  Skin: Negative for rash.  Neurological: Negative for dizziness, weakness and headaches.     Vitals BP 130/86   Pulse 84   Ht 6' (1.829 m)   Wt 212 lb (96.2 kg)   SpO2 99%   BMI 28.75  kg/m   Objective:   Physical Exam Vitals and nursing note reviewed.  Constitutional:      General: He is not in acute distress.    Appearance: Normal appearance. He is not ill-appearing.  Cardiovascular:     Rate and Rhythm: Normal rate and regular rhythm.     Pulses: Normal pulses.     Heart sounds: Normal heart sounds.  Pulmonary:     Effort: Pulmonary effort is normal. No respiratory distress.     Breath sounds: Normal breath sounds.  Musculoskeletal:        General: No swelling, deformity or signs of injury. Normal range of motion.     Comments: Lumbar- ttp over left lower back, normal inspection to skin.  No spinous process ttp over T or L spine. Neg left CVAT. Neg-slr bilaterally. Dec rom with flexion.   Skin:    General: Skin is warm and dry.     Findings: No rash.  Neurological:     General: No focal deficit present.     Mental Status: He is alert and oriented to person, place, and time.     Cranial Nerves: No cranial nerve deficit.     Sensory: No sensory deficit.     Motor: No weakness.     Gait: Gait normal.  Psychiatric:        Mood and Affect: Mood  normal.        Behavior: Behavior normal.     Assessment and Plan   1. Lumbar back pain - diclofenac (VOLTAREN) 75 MG EC tablet; Take 1 tablet (75 mg total) by mouth 2 (two) times daily.  Dispense: 30 tablet; Refill: 0 - cyclobenzaprine (FLEXERIL) 10 MG tablet; Take 1 tablet (10 mg total) by mouth 2 (two) times daily as needed for muscle spasms.  Dispense: 30 tablet; Refill: 0   Gave diclofenac and flexeril prn.  Use heat/ice, and stretching Recommending conservative measures and if worsneing or not improving may need imaging.  Exercise handout given. Pt in agreement. Pt to call back if worsening pain in the next 2-3 wks.   Return if symptoms worsen or fail to improve.  09/03/2020

## 2020-09-03 ENCOUNTER — Encounter: Payer: Self-pay | Admitting: Family Medicine

## 2020-09-09 ENCOUNTER — Ambulatory Visit: Payer: BC Managed Care – PPO | Admitting: Podiatry

## 2020-09-11 NOTE — Progress Notes (Signed)
  Subjective:  Patient ID: Paul Watson, male    DOB: 02-29-1986,  MRN: [id number]  Chief Complaint  Patient presents with  . Follow-up    3 wk F/u laser     35 y.o. male presents with the above complaint. History confirmed with patient.  Thinks it is doing much better. Debrided lesion himself prior to visit.  Objective:  Physical Exam: warm, good capillary refill, no trophic changes or ulcerative lesions, normal DP and PT pulses and normal sensory exam. Left Foot: Resolving Verrucous lesion to the plantar aspect of the first metatarsal nonweightbearing area.  Assessment:   1. Verruca plantaris    Plan:  Patient was evaluated and treated and all questions answered.  Verruca -Likely final treatment for verruca today. No debridement, destroyed as below  Procedure: Destruction of Lesion Location: left forefoot Anesthesia: none Instrumentation: 15 blade, YAG laser Technique: Debridement of lesion , followed by several passes of Yag laser to destroy lesion.  No follow-ups on file.

## 2020-09-30 ENCOUNTER — Ambulatory Visit: Payer: BC Managed Care – PPO | Admitting: Podiatry

## 2020-10-03 ENCOUNTER — Other Ambulatory Visit: Payer: Self-pay

## 2020-10-03 ENCOUNTER — Ambulatory Visit (INDEPENDENT_AMBULATORY_CARE_PROVIDER_SITE_OTHER): Payer: BC Managed Care – PPO | Admitting: Podiatry

## 2020-10-03 DIAGNOSIS — B070 Plantar wart: Secondary | ICD-10-CM

## 2020-10-03 DIAGNOSIS — M79672 Pain in left foot: Secondary | ICD-10-CM

## 2020-10-14 ENCOUNTER — Encounter: Payer: Self-pay | Admitting: Family Medicine

## 2020-10-14 ENCOUNTER — Ambulatory Visit (INDEPENDENT_AMBULATORY_CARE_PROVIDER_SITE_OTHER): Payer: BC Managed Care – PPO | Admitting: Family Medicine

## 2020-10-14 ENCOUNTER — Other Ambulatory Visit: Payer: Self-pay

## 2020-10-14 ENCOUNTER — Ambulatory Visit (HOSPITAL_COMMUNITY)
Admission: RE | Admit: 2020-10-14 | Discharge: 2020-10-14 | Disposition: A | Discharge status: Home / Self Care | Payer: BC Managed Care – PPO | Source: Ambulatory Visit | Attending: Family Medicine | Admitting: Family Medicine

## 2020-10-14 VITALS — BP 128/68 | Temp 97.0°F | Ht 72.0 in | Wt 215.6 lb

## 2020-10-14 DIAGNOSIS — M545 Low back pain, unspecified: Secondary | ICD-10-CM

## 2020-10-14 DIAGNOSIS — M79605 Pain in left leg: Secondary | ICD-10-CM | POA: Diagnosis not present

## 2020-10-14 MED ORDER — DICLOFENAC SODIUM 75 MG PO TBEC: 75.0000 mg | DELAYED_RELEASE_TABLET | Freq: Two times a day (BID) | ORAL | 0 refills | Status: DC

## 2020-10-14 MED ORDER — METHOCARBAMOL 500 MG PO TABS: 500.0000 mg | ORAL_TABLET | Freq: Three times a day (TID) | ORAL | 0 refills | Status: DC | PRN

## 2020-10-14 NOTE — Progress Notes (Signed)
Patient ID: Paul Watson, male    DOB: Jun 01, 1986, 35 y.o.   MRN: [id number]   Chief Complaint  Patient presents with  . Back Pain   Subjective:    HPI Pt having lower back pain. Began 4 days ago. Radiates from back to ankle. Pt thinks he may have pulled something. Pt taking Ibuprofen and Tylenol. Pt wife pulled back not to long ago and took his wife's Methocarbamol and pt has taken that for the past 2 days.  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 No event that brought it on again. Had this last flare in 3/22. No urinary issues.  No bowel or bladder incontinence.  No saddle anesthesia.  Medical History Paul Watson has a past medical history of Fractures.   Outpatient Encounter Medications as of 10/14/2020  Medication Sig  . diclofenac (VOLTAREN) 75 MG EC tablet Take 1 tablet (75 mg total) by mouth 2 (two) times daily. (Patient not taking: Reported on 10/24/2020)  . methocarbamol (ROBAXIN) 500 MG tablet Take 1 tablet (500 mg total) by mouth every 8 (eight) hours as needed for muscle spasms. (Patient not taking: Reported on 10/24/2020)  . [DISCONTINUED] cyclobenzaprine (FLEXERIL) 10 MG tablet Take 1 tablet (10 mg total) by mouth 2 (two) times daily as needed for muscle spasms.  . [DISCONTINUED] diclofenac (VOLTAREN) 75 MG EC tablet Take 1 tablet (75 mg total) by mouth 2 (two) times daily.   No facility-administered encounter medications on file as of 10/14/2020.     Review of Systems  Constitutional: Negative for chills and fever.  HENT: Negative for congestion, rhinorrhea and sore throat.   Respiratory: Negative for cough, shortness of breath and wheezing.   Cardiovascular: Negative for chest pain and leg swelling.  Gastrointestinal: Negative for abdominal pain, diarrhea, nausea and vomiting.  Genitourinary: Negative for difficulty urinating, dysuria, frequency, hematuria and urgency.   Musculoskeletal: Positive for back pain.  Skin: Negative for rash.  Neurological: Negative for dizziness, weakness and headaches.     Vitals BP 128/68   Temp (!) 97 F (36.1 C)   Ht 6' (1.829 m)   Wt 215 lb 9.6 oz (97.8 kg)   BMI 29.24 kg/m   Objective:   Physical Exam Vitals and nursing note reviewed.  Constitutional:      General: He is not in acute distress.    Appearance: Normal appearance. He is not ill-appearing.  HENT:     Head: Normocephalic.     Nose: Nose normal. No congestion.     Mouth/Throat:     Mouth: Mucous membranes are moist.     Pharynx: No oropharyngeal exudate.  Eyes:     Extraocular Movements: Extraocular movements intact.     Conjunctiva/sclera: Conjunctivae normal.     Pupils: Pupils are equal, round, and reactive to light.  Cardiovascular:     Rate and Rhythm: Normal rate and regular rhythm.     Pulses: Normal pulses.     Heart sounds: Normal heart sounds. No murmur heard.   Pulmonary:     Effort: Pulmonary effort is normal.     Breath sounds: Normal breath sounds. No wheezing, rhonchi or rales.  Musculoskeletal:        General: Normal range of motion.     Right lower leg: No edema.     Left lower leg: No edema.     Comments: +ttp over lumbar paraspinal area on left.  No lumbar or thoracic spinous process tenderness.  Dec rom with flexion.  SLR positive on left.  Negative slr on right. Normal ms LE bilaterally and normal sensation.   Skin:    General: Skin is warm and dry.     Findings: No rash.  Neurological:     General: No focal deficit present.     Mental Status: He is alert and oriented to person, place, and time.     Cranial Nerves: No cranial nerve deficit.  Psychiatric:        Mood and Affect: Mood normal.        Behavior: Behavior normal.        Thought Content: Thought content normal.        Judgment: Judgment normal.      Assessment and Plan   1. Lumbar pain with radiation down left leg - DG Lumbar Spine  Complete; Future - methocarbamol (ROBAXIN) 500 MG tablet; Take 1 tablet (500 mg total) by mouth every 8 (eight) hours as needed for muscle spasms. (Patient not taking: Reported on 10/24/2020)  Dispense: 45 tablet; Refill: 0 - diclofenac (VOLTAREN) 75 MG EC tablet; Take 1 tablet (75 mg total) by mouth 2 (two) times daily. (Patient not taking: Reported on 10/24/2020)  Dispense: 60 tablet; Refill: 0   Lumbar pain- diclofenac and robaxin prn.  Heat/ice and stretches.  If not improving in 2-3 weeks to call or rto.  Xray lumbar ordered.   Return in about 6 weeks (around 11/25/2020) for f/u back pain.Marland Kitchen

## 2020-10-16 NOTE — Progress Notes (Signed)
  Subjective:  Patient ID: Paul Watson, male    DOB: 20-Nov-1985,  MRN: [id number]  Chief Complaint  Patient presents with  . Plantar Warts    3 week follow up   35 y.o. male presents with the above complaint. History confirmed with patient. States the lesion came back a bit.  Objective:  Physical Exam: warm, good capillary refill, no trophic changes or ulcerative lesions, normal DP and PT pulses and normal sensory exam. Left Foot: Verrucous lesion to the plantar aspect of the first metatarsal nonweightbearing area.  Assessment:   1. Verruca plantaris   2. Pain in left foot    Plan:  Patient was evaluated and treated and all questions answered.  Verruca -Repeat debridement of verruca  Procedure: Destruction of Lesion Location: left forefoot Anesthesia: none Instrumentation: 15 blade, YAG laser Technique: Debridement of lesion , followed by several passes of Yag laser to destroy lesion.   Return in about 3 weeks (around 10/24/2020) for Verruca.

## 2020-10-24 ENCOUNTER — Other Ambulatory Visit: Payer: Self-pay

## 2020-10-24 ENCOUNTER — Ambulatory Visit (INDEPENDENT_AMBULATORY_CARE_PROVIDER_SITE_OTHER): Payer: BC Managed Care – PPO | Admitting: Podiatry

## 2020-10-24 DIAGNOSIS — B070 Plantar wart: Secondary | ICD-10-CM | POA: Diagnosis not present

## 2020-10-24 NOTE — Progress Notes (Signed)
  Subjective:  Patient ID: Paul Watson, male    DOB: 12-16-1985,  MRN: [id number]  Chief Complaint  Patient presents with  . Plantar Warts    Pt states improvement with laser   35 y.o. male presents with the above complaint. History confirmed with patient. States the lesion came back a bit.  Objective:  Physical Exam: warm, good capillary refill, no trophic changes or ulcerative lesions, normal DP and PT pulses and normal sensory exam. Left Foot: Verrucous lesion to the plantar aspect of the first metatarsal nonweightbearing area.  Assessment:   1. Verruca plantaris    Plan:  Patient was evaluated and treated and all questions answered.  Verruca -Hopefully final destruction of verruca as below  Procedure: Destruction of Lesion Location: left forefoot Anesthesia: none Instrumentation: 15 blade, YAG laser Technique: Debridement of lesion , followed by several passes of Yag laser to destroy lesion. Salinocaine applied  No follow-ups on file.

## 2020-11-24 ENCOUNTER — Other Ambulatory Visit: Payer: Self-pay

## 2020-11-24 ENCOUNTER — Telehealth: Payer: Self-pay | Admitting: *Deleted

## 2020-11-24 ENCOUNTER — Encounter: Payer: Self-pay | Admitting: Family Medicine

## 2020-11-24 ENCOUNTER — Telehealth: Payer: Self-pay

## 2020-11-24 ENCOUNTER — Telehealth (INDEPENDENT_AMBULATORY_CARE_PROVIDER_SITE_OTHER): Payer: BC Managed Care – PPO | Admitting: Family Medicine

## 2020-11-24 DIAGNOSIS — U071 COVID-19: Secondary | ICD-10-CM

## 2020-11-24 NOTE — Telephone Encounter (Signed)
[name], mullaly are scheduled for a virtual visit with your provider today.    Just as we do with appointments in the office, we must obtain your consent to participate.  Your consent will be active for this visit and any virtual visit you may have with one of our providers in the next 365 days.    If you have a MyChart account, I can also send a copy of this consent to you electronically.  All virtual visits are billed to your insurance company just like a traditional visit in the office.  As this is a virtual visit, video technology does not allow for your provider to perform a traditional examination.  This may limit your provider's ability to fully assess your condition.  If your provider identifies any concerns that need to be evaluated in person or the need to arrange testing such as labs, EKG, etc, we will make arrangements to do so.    Although advances in technology are sophisticated, we cannot ensure that it will always work on either your end or our end.  If the connection with a video visit is poor, we may have to switch to a telephone visit.  With either a video or telephone visit, we are not always able to ensure that we have a secure connection.   I need to obtain your verbal consent now.   Are you willing to proceed with your visit today?   Paul Watson has provided verbal consent on 11/24/2020 for a virtual visit (video or telephone).

## 2020-11-24 NOTE — Telephone Encounter (Signed)
Patient scheduled phone visit today with Dr Arva Chafe to discuss further.

## 2020-11-24 NOTE — Progress Notes (Addendum)
   Subjective:    Patient ID: Paul Watson, male    DOB: February 18, 1986, 35 y.o.   MRN: [id number]  HPI  Patient would like to discuss the use of antivirals since testing positive for Covid  Patient was exposed on Friday.  Then on Sunday started having sore throat congestion and some runny nose No wheezing or difficulty breathing Energy level okay Denied any high fever chills.  No vomiting or diarrhea.  Currently feels a little better this afternoon. Review of Systems  Virtual Visit via Telephone Note  I connected with Paul Watson on 11/24/20 at  5:00 PM EDT by telephone and verified that I am speaking with the correct person using two identifiers.  Location: Patient: Home Provider: Office   I discussed the limitations, risks, security and privacy concerns of performing an evaluation and management service by telephone and the availability of in person appointments. I also discussed with the patient that there may be a patient responsible charge related to this service. The patient expressed understanding and agreed to proceed.   History of Present Illness:    Observations/Objective:   Assessment and Plan:   Follow Up Instructions:    I discussed the assessment and treatment plan with the patient. The patient was provided an opportunity to ask questions and all were answered. The patient agreed with the plan and demonstrated an understanding of the instructions.   The patient was advised to call back or seek an in-person evaluation if the symptoms worsen or if the condition fails to improve as anticipated.  I provided 15 minutes of non-face-to-face time during this encounter.       Objective:   Physical Exam  Today's visit was via telephone Physical exam was not possible for this visit       Assessment & Plan:  COVID infection Viral process Symptomatology and current condition was discussed Patient has had his vaccine and booster Does not have any  underlying health issues.  Is not morbidly obese. We did discuss how he does have options including antivirals.  We did discuss side effects of viral's.  We also discussed watchful waiting. Patient will see how he would do over the course of next 24 hours and in 1 week give [place] an update tomorrow.  If he is worsening we will move forward with antivirals.  Currently right now without comorbidities relatively young age previously vaccinated and mild symptoms unlikely to have major complications.  He will give [place] update tomorrow  Addendum-patient related that he had onset of significant head congestion postnasal drainage also chest congestion and coughing from last evening to this morning.  Given the start of the events I would recommend initiating antiviral medicine we will send in Paxlovid he will keep [place] updated how he is doing if this medication causes severe vomiting or diarrhea he will let us know-he will keep [place] updated with any downward trends-patient is aware of warning signs to watch for-he is also aware of medication side effects-GFR normal

## 2020-11-24 NOTE — Telephone Encounter (Signed)
The medications can lessen the severity of COVID These medications are more often utilized with individuals who are considered a high risk High risk individuals will include adults in their 57s or higher, chronic lung disease, underlying diabetes or heart disease Typically these medications are not utilized with younger healthy individuals. But if a low risk individual prefers medication we will do a phone visit then sent in the medication. If he is interested in the medication we would need to add him as a phone visit to the end of the day and we will discuss the pros and cons regarding medication Please see what he would like to do thank you

## 2020-11-24 NOTE — Telephone Encounter (Signed)
Tested positive for Covid yesterday and the E-visit doctor said that he could not prescribe Medication for Covid and have to go through primary care doctor.   Pt call back [phone]

## 2020-11-24 NOTE — Telephone Encounter (Signed)
Patient states he has headaches, cold chills and sinus pressure- slight cough and NO SOB. Patient not sure if he needs the medication but states he is in the 5 day window- he did a e visit and they would not send in the antiviral for hime

## 2020-11-25 ENCOUNTER — Encounter: Payer: Self-pay | Admitting: Family Medicine

## 2020-11-25 MED ORDER — NIRMATRELVIR/RITONAVIR (PAXLOVID)TABLET: 3.0000 | ORAL_TABLET | Freq: Two times a day (BID) | ORAL | 0 refills | Status: AC

## 2020-11-25 NOTE — Addendum Note (Signed)
Addended by: Sallee Lange A on: 11/25/2020 02:05 PM   Modules accepted: Orders

## 2020-11-26 ENCOUNTER — Ambulatory Visit: Payer: BC Managed Care – PPO | Admitting: Family Medicine

## 2020-12-10 DIAGNOSIS — I781 Nevus, non-neoplastic: Secondary | ICD-10-CM | POA: Diagnosis not present

## 2021-01-02 ENCOUNTER — Encounter: Payer: Self-pay | Admitting: Urology

## 2021-01-02 ENCOUNTER — Ambulatory Visit (INDEPENDENT_AMBULATORY_CARE_PROVIDER_SITE_OTHER): Payer: BC Managed Care – PPO | Admitting: Urology

## 2021-01-02 ENCOUNTER — Other Ambulatory Visit: Payer: Self-pay

## 2021-01-02 VITALS — BP 147/93 | HR 65 | Temp 98.3°F

## 2021-01-02 DIAGNOSIS — Z3009 Encounter for other general counseling and advice on contraception: Secondary | ICD-10-CM | POA: Diagnosis not present

## 2021-01-02 MED ORDER — DIAZEPAM 10 MG PO TABS: 10.0000 mg | ORAL_TABLET | Freq: Once | ORAL | 0 refills | Status: AC

## 2021-01-02 NOTE — Patient Instructions (Signed)
Pre-Vasectomy Instructions  STOP all aspirin or blood thinners (Aspirin, Plavix, Coumadin, Warfarin, Motrin, Ibuprofen, Advil, Aleve, Naproxen, Naprosyn) for 7 days prior to the procedure.  If you have any questions about stopping these medications please contact your primary care physician or cardiologist.  Shave all hair from the upper scrotum on the day of the procedure.  This means just under the penis onto the scrotal sac.  The area shaved should measure about 2-3 inches around.  You may lather the scrotum with soap and water, and shave with a safety razor.  After shaving the area, thoroughly wash the penis and the scrotum, then shower or bathe to remove all the loose hairs.  If needed, wash the area again just before coming in for your circumcision.  It is recommended to have a light meal an hour or so prior to the procedure.  Bring a scrotal support (jock strap or suspensory, or tight jockey shorts or underwear).  Wear comfortable pants or shorts.  While the actual procedure usually takes about 45 minutes, you should be prepared to stay in the office for approximately one hour.  Bring someone with you to drive you home.  If you have any questions or concerns, please feel free to call the office at [phone].  Post Vasectomy After Care This sheet gives you information about how to care for yourself after your procedure.  What can I expect after the procedure? After the procedure, it is common to have: Mild/moderate pain and discomfort. If you have pain or discomfort immediately after the vasectomy, you may use OTC pain medication for relief, ex: Tylenol, Ibuprofen. After the local anesthetic wears off an ice pack may help to provide additional comfort and can also prevent swelling. Do not place ice pack directly on your skin.  Swelling of your scrotum/testicles or or slight redness on your scrotum/testicles around the incision site. Black and Blue bruising as the tissue heals Some  slight blood/drainage coming from your incisions or puncture sites for 1 or 2 days. If you have stitches placed they do not need to be removed, they will dissolve on their own after time.  Edges of the incision may come apart and heal slowly, sometimes a knot may be present which can remain for several months. This is normal and part of the healing process Blood in your semen. Follow these instructions at home: Activity For the first 48-72 hours after the procedure, avoid physical activity and exercise that requires heavy lifting. (5-10lbs) Do not take part in sports or perform heavy physical labor until your pain has improved, or until your health care provider says it is okay. You may have limits on the amount of weight you can lift as told by your health care provider. Do not ejaculate for at least 1 week after the procedure, or for as long as you are told. You may resume sexual activity 7-10 days after your procedure, or when your health care provider approves. Use a different method of birth control (contraception) until you have had test results that confirm that there is no sperm in your semen.  Wound Care: Shower only after 24 hours No tub baths, hot tub, or pools for at least 7 days Scrotal support Use scrotal support, such as a jockstrap or underwear with a supportive pouch, as needed for 1 week after your procedure. If you feel discomfort in your scrotum, you may remove the scrotal support to see if the discomfort is relieved. Sometimes scrotal support can  press on the scrotum and cause or worsen discomfort. If your skin gets irritated, you may add some germ-free (sterile), fluffed bandages or a clean washcloth to the scrotal support. Managing pain and swelling If directed, put ice on the affected area. To do this: Put ice in a plastic bag. Place a towel between your skin and the bag. Leave the ice on for 20 minutes, 2-3 times a day. Remove the ice if your skin turns bright red.  This is very important. If you cannot feel pain, heat, or cold, you have a greater risk of damage to the area.      TO DO:  10-15 Ejaculations to help clear the passage of sperm, you must be sure to use another form of birth control until you are told you may discontinue use!! You will be given a specimen cup to bring back a semen sample in 3 months for analysis, will call with results Contact a health care provider if: You have pus or a bad smell coming from an incision or puncture site. You have a fever of 101 or greater  Significant drainage or bleeding from the incision site Generalized redness of scrotum

## 2021-01-02 NOTE — Progress Notes (Signed)
01/02/2021 1:23 PM   Paul Watson [birth year] [id number]  Referring provider: Erven Colla, DO [address] [place],  [place] [address]  Chief Complaint  Patient presents with   VAS Consult    New Patient    HPI: Paul Watson is a 35yo here for consideration of vasectomy. He has 2 healthy children age 2 years and 6 months. He has a hx of prostatitis. He has a hx of inguinal hernia repair 2019. No LUTS. No ED.    PMH: Past Medical History:  Diagnosis Date   Fractures     Surgical History: Past Surgical History:  Procedure Laterality Date   ANKLE FRACTURE SURGERY     INGUINAL HERNIA REPAIR Right 06/05/2018   Procedure: HERNIA REPAIR INGUINAL ADULT WITH MESH;  Surgeon: Aviva Signs, MD;  Location: [facility];  Service: General;  Laterality: Right;    Home Medications:  Allergies as of 01/02/2021   No Known Allergies      Medication List        Accurate as of January 02, 2021  1:23 PM. If you have any questions, ask your nurse or doctor.          STOP taking these medications    diclofenac 75 MG EC tablet Commonly known as: VOLTAREN Stopped by: Nicolette Bang, MD   methocarbamol 500 MG tablet Commonly known as: Robaxin Stopped by: Nicolette Bang, MD        Allergies: No Known Allergies  Family History: Family History  Problem Relation Age of Onset   Melanoma Maternal Grandmother    Arthritis Other        family history    Prostate cancer Neg Hx    Bladder Cancer Neg Hx    Kidney cancer Neg Hx     Social History:  reports that he has never smoked. He has never used smokeless tobacco. He reports that he does not drink alcohol. No history on file for drug use.  ROS: All other review of systems were reviewed and are negative except what is noted above in HPI  Physical Exam: BP (!) 147/93   Pulse 65   Temp 98.3 F (36.8 C)   Constitutional:  Alert and oriented, No acute distress. HEENT: [place] AT, moist mucus membranes.  Trachea midline, no  masses. Cardiovascular: No clubbing, cyanosis, or edema. Respiratory: Normal respiratory effort, no increased work of breathing. GI: Abdomen is soft, nontender, nondistended, no abdominal masses GU: No CVA tenderness. Circumcised phallus. No masses/lesions on penis, testis, scrotum. Bilaterla vas deferens palpable  Lymph: No cervical or inguinal lymphadenopathy. Skin: No rashes, bruises or suspicious lesions. Neurologic: Grossly intact, no focal deficits, moving all 4 extremities. Psychiatric: Normal mood and affect.  Laboratory Data: No results found for: WBC, HGB, HCT, MCV, PLT  Lab Results  Component Value Date   CREATININE 1.03 10/15/2017    No results found for: PSA  No results found for: TESTOSTERONE  No results found for: HGBA1C  Urinalysis    Component Value Date/Time   BILIRUBINUR ++ 11/08/2014 1411   LEUKOCYTESUR large (3+) 10/25/2012 1133    No results found for: LABMICR, WBCUA, RBCUA, LABEPIT, MUCUS, BACTERIA  Pertinent Imaging:  No results found for this or any previous visit.  No results found for this or any previous visit.  No results found for this or any previous visit.  No results found for this or any previous visit.  No results found for this or any previous visit.  No results found  for this or any previous visit.  No results found for this or any previous visit.  No results found for this or any previous visit.   Assessment & Plan:    1. Vasectomy evaluation -Schedule for vasectomy -Rx for valium given   No follow-ups on file.  Nicolette Bang, MD  [facility] [place]

## 2021-01-02 NOTE — Progress Notes (Signed)

## 2021-03-06 ENCOUNTER — Ambulatory Visit (INDEPENDENT_AMBULATORY_CARE_PROVIDER_SITE_OTHER): Payer: BC Managed Care – PPO | Admitting: Urology

## 2021-03-06 ENCOUNTER — Other Ambulatory Visit: Payer: Self-pay

## 2021-03-06 ENCOUNTER — Ambulatory Visit: Payer: BC Managed Care – PPO | Admitting: Urology

## 2021-03-06 ENCOUNTER — Encounter: Payer: Self-pay | Admitting: Urology

## 2021-03-06 VITALS — BP 135/86 | HR 82 | Temp 97.9°F | Ht 72.0 in | Wt 218.0 lb

## 2021-03-06 DIAGNOSIS — Z302 Encounter for sterilization: Secondary | ICD-10-CM | POA: Diagnosis not present

## 2021-03-06 DIAGNOSIS — Z9852 Vasectomy status: Secondary | ICD-10-CM

## 2021-03-06 NOTE — Patient Instructions (Signed)
Vasectomy Postoperative Instructions  Please bring back a semen analysis in approximately 3 months.  Your semen analysis  will need to be taken to  [place], [place] [address] There phone number is [phone]. Please call and schedule an appointment before taking your specimen.    You will be given a sterile specimen cup. Please label the cup with your name, date of birth, date and time of collections.  What to Expect  - slight redness, swelling and scant drainage along the incision  - mild to moderate discomfort  - black and blue (bruising) as the tissue heals  - low grade fever  - scrotal sensitivity and/or tenderness - Edges of the incision may pull apart and heal slowly, sometimes a knot may be present which remains for several months.  This is NORMAL and all part of the healing process. - if stitches are placed, they do not need to be removed - if you have pain or discomfort immediately after the vasectomy, you may use OTC pain medication for relief , ex: tylenol.  After local anesthetic wears off an ice pack will provide additional comfort and can also prevent swelling if used  Activity  - no sexual intercourse for at lease 5 days depending on comfort  - no heavy lifting for 48-72 hours (anything over 5-10 lbs)  Wound Care  - shower only after 24 hours  - no tub baths, hot tub, or pools for at least 7 days  - ice packs for 48 hours: 30 minutes on and 30 minutes off  Problem to Report  - generalized redness  - increased pain and swelling  - fever greater than 101 F  - significant drainage or bleeding from the wound  TO DO - Ejaculations help to clear the passage of sperm, but you must use another from of birth control until you are told you may discontinue its use!! - You will be given a specimen cup to bring back a semen sample in 3 months to check and see if its clear of sperm.  Only after the semen is sent for analysis and is reported back as clear  should you use this as your primary form of birth control!

## 2021-03-13 ENCOUNTER — Encounter: Payer: Self-pay | Admitting: Urology

## 2021-03-13 NOTE — Progress Notes (Signed)
   CC: desires sterilization   HPI: Mr Brazel is a 35yo here for vasectomy  Blood pressure 135/86, pulse 82, temperature 97.9 F (36.6 C), height 6' (1.829 m), weight 218 lb (98.9 kg). NED. A&Ox3.   No respiratory distress   Abd soft, NT, ND Normal external genitalia with patent urethral meatus   Bilateral Vasectomy Procedure  Pre-Procedure: - Patient's scrotum was prepped and draped for vasectomy. - The vas was palpated through the scrotal skin on the left. - 1% Xylocaine was injected into the skin and surrounding tissue for placement  - In a similar manner, the vas on the right was identified, anesthetized, and stabilized.  Procedure: - A #11 blade was used to make a small stab incision in the skin overlying the vas - The left vas was isolated and brought up through the incision exposing that structure. - Bleeding points were cauterized as they occurred. - The vas was free from the surrounding structures and brought to the view. - A segment was positioned for placement with a hemostat. - A second hemostat was placed and a small segment between the two hemostats and was removed for inspection. - Each end of the transected vas lumen was fulgurated/ obliterated using needlepoint electrocautery -A fascial interposition was performed on testicular end of the vas using #3-0 chromic suture -The same procedure was performed on the right. - A single suture of #3-0 chromic catgut was used to close each lateral scrotal skin incision - A dressing was applied.  Post-Procedure: - Patient was instructed in care of the operative area - A specimen is to be delivered in 12 weeks   -Another form of contraception is to be used until post vasectomy semen analysis  Nicolette Bang, MD

## 2021-03-19 ENCOUNTER — Telehealth: Payer: Self-pay

## 2021-03-19 NOTE — Telephone Encounter (Signed)
Patient had vasectomy on 9/23 Patient states that when he wakes up in the morning he has discomfort described as soreness and tightness only in the right testicle.  He states "It feels like the tube is hard ". Patient continues to sleep with a pillow b/t leg for comfort. No significant swelling noted. Please advise

## 2021-04-01 DIAGNOSIS — M545 Low back pain, unspecified: Secondary | ICD-10-CM | POA: Diagnosis not present

## 2021-04-08 DIAGNOSIS — M545 Low back pain, unspecified: Secondary | ICD-10-CM | POA: Diagnosis not present

## 2021-04-09 ENCOUNTER — Other Ambulatory Visit (HOSPITAL_COMMUNITY): Payer: Self-pay | Admitting: Orthopedic Surgery

## 2021-04-09 ENCOUNTER — Other Ambulatory Visit: Payer: Self-pay | Admitting: Orthopedic Surgery

## 2021-04-09 DIAGNOSIS — M545 Low back pain, unspecified: Secondary | ICD-10-CM

## 2021-04-24 ENCOUNTER — Ambulatory Visit (HOSPITAL_COMMUNITY)
Admission: RE | Admit: 2021-04-24 | Discharge: 2021-04-24 | Disposition: A | Discharge status: Home / Self Care | Payer: BC Managed Care – PPO | Source: Ambulatory Visit | Attending: Orthopedic Surgery | Admitting: Orthopedic Surgery

## 2021-04-24 ENCOUNTER — Other Ambulatory Visit: Payer: Self-pay

## 2021-04-24 DIAGNOSIS — M545 Low back pain, unspecified: Secondary | ICD-10-CM | POA: Insufficient documentation

## 2021-05-11 ENCOUNTER — Ambulatory Visit: Payer: BC Managed Care – PPO | Admitting: Family Medicine

## 2021-05-12 DIAGNOSIS — M545 Low back pain, unspecified: Secondary | ICD-10-CM | POA: Diagnosis not present

## 2021-05-21 ENCOUNTER — Telehealth: Payer: BC Managed Care – PPO | Admitting: Physician Assistant

## 2021-05-21 DIAGNOSIS — B9689 Other specified bacterial agents as the cause of diseases classified elsewhere: Secondary | ICD-10-CM | POA: Diagnosis not present

## 2021-05-21 DIAGNOSIS — J208 Acute bronchitis due to other specified organisms: Secondary | ICD-10-CM

## 2021-05-21 MED ORDER — AZITHROMYCIN 250 MG PO TABS: ORAL_TABLET | ORAL | 0 refills | Status: AC

## 2021-05-21 MED ORDER — PREDNISONE 10 MG (21) PO TBPK: ORAL_TABLET | ORAL | 0 refills | Status: DC

## 2021-05-21 MED ORDER — BENZONATATE 100 MG PO CAPS: 100.0000 mg | ORAL_CAPSULE | Freq: Three times a day (TID) | ORAL | 0 refills | Status: DC | PRN

## 2021-05-21 NOTE — Progress Notes (Signed)

## 2021-06-11 DIAGNOSIS — Z9852 Vasectomy status: Secondary | ICD-10-CM | POA: Diagnosis not present

## 2021-06-14 LAB — SEMEN ANALYSIS, BASIC
Appearance: NORMAL
Concentration, Sperm: <2 x10E6/mL — ABNORMAL LOW (ref >14.9)
Leukocyte Concentration: >1 x10E6/mL — ABNORMAL HIGH (ref <1.00)
Time Collected: 920
Time Received: 1000
Time Since Last Emission: 3 days
Volume: 5.8 mL (ref >1.4)
pH: 8 (ref >7.1)

## 2021-06-16 ENCOUNTER — Encounter: Payer: Self-pay | Admitting: Urology

## 2021-12-09 DIAGNOSIS — D239 Other benign neoplasm of skin, unspecified: Secondary | ICD-10-CM | POA: Diagnosis not present

## 2022-12-06 DIAGNOSIS — D485 Neoplasm of uncertain behavior of skin: Secondary | ICD-10-CM | POA: Diagnosis not present

## 2023-02-25 DIAGNOSIS — M545 Low back pain, unspecified: Secondary | ICD-10-CM | POA: Diagnosis not present

## 2023-03-18 ENCOUNTER — Encounter: Payer: BC Managed Care – PPO | Admitting: Family Medicine

## 2023-03-27 IMAGING — MR MR LUMBAR SPINE W/O CM
4 of 5 series · 30 of 48 positions shown · non-contrast
Comparison: Lumbar spine radiographs 10/14/2020.

CLINICAL DATA: Low back pain, unspecified back pain laterality,
unspecified chronicity, unspecified whether sciatica present.
Additional history provided by scanning technologist: Patient
reports chronic low back pain.

EXAM:
MRI LUMBAR SPINE WITHOUT CONTRAST
TECHNIQUE: Multiplanar, multisequence MR imaging of the lumbar spine was
performed. No intravenous contrast was administered.

[Series 5: T2 · sagittal · 4.0mm · 0.68mm/px · 6 of 15 slices shown (1 of 2)]
[im 1/15]
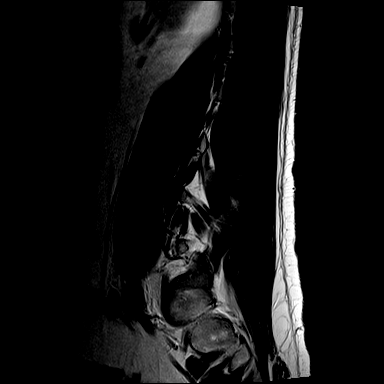
[im 3/15]
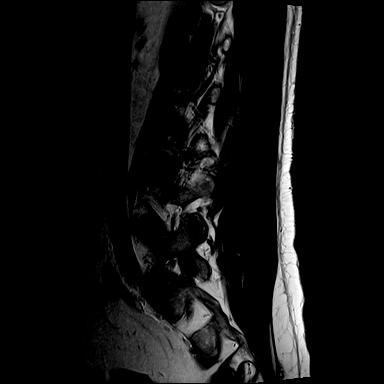
[im 6/15]
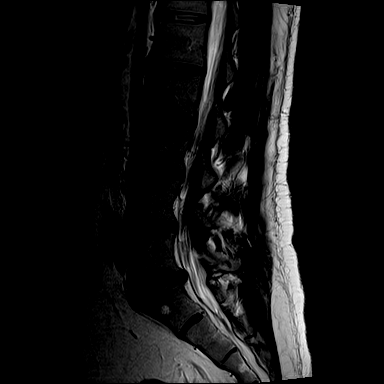
[im 9/15]
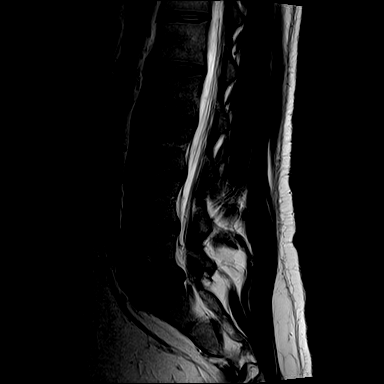
[im 12/15]
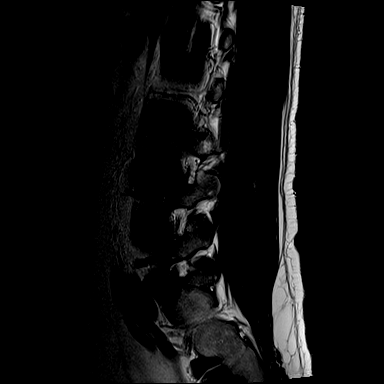
[im 15/15]
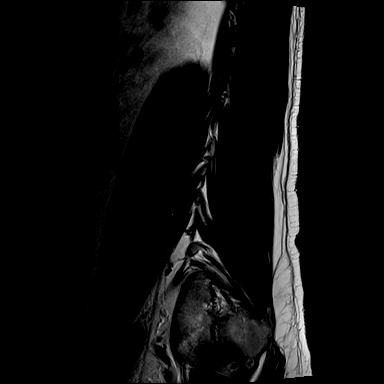

[Series 6: T1 · sagittal · 4.0mm · 0.81mm/px · 6 of 15 slices shown (1 of 2)]
[im 1/15]
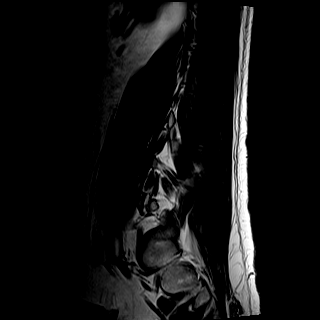
[im 3/15]
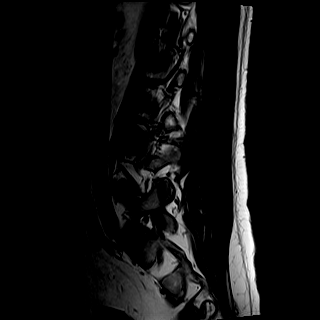
[im 6/15]
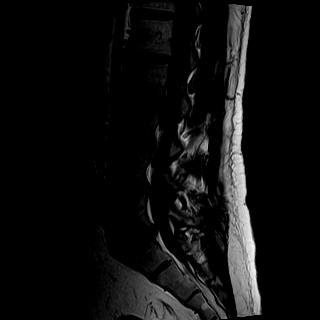
[im 9/15]
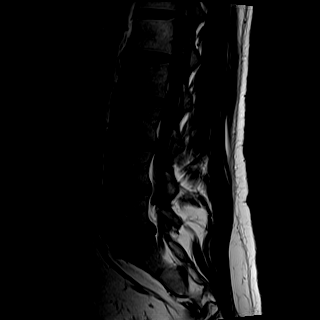
[im 12/15]
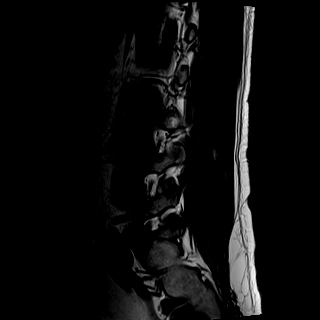
[im 15/15]
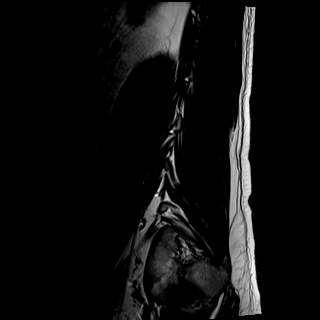

[Series 8: T2 · axial · 4.0mm · 0.70mm/px · z∈[-192,+11]mm · 9 of 35 slices shown (2 of 2)]
[im 1/35]
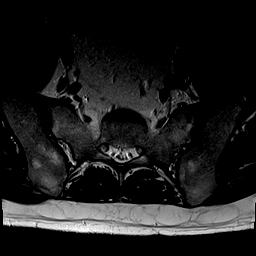
[im 5/35]
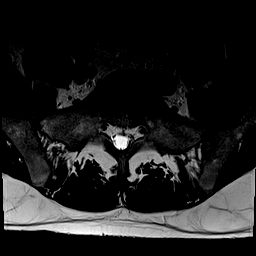
[im 10/35]
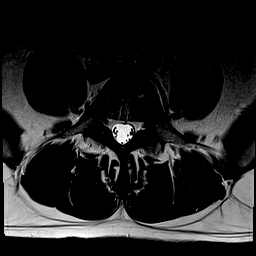
[im 15/35]
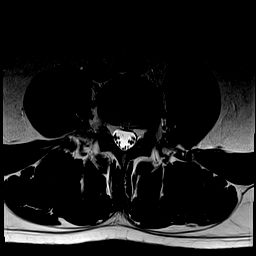
[im 18/35]
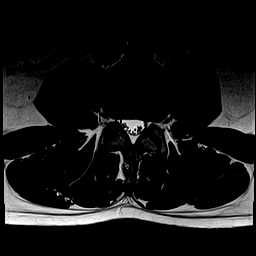
[im 20/35]
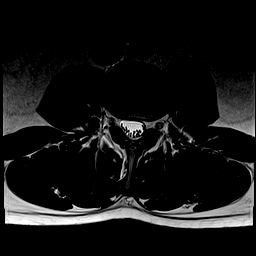
[im 25/35]
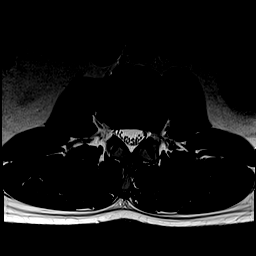
[im 30/35]
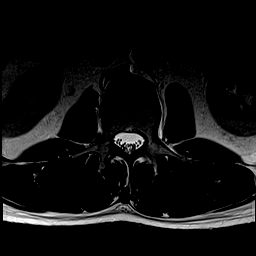
[im 35/35]
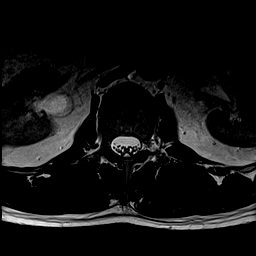

[Series 9: T1 · axial · 4.0mm · 0.35mm/px · z∈[-192,+11]mm · 9 of 35 slices shown (2 of 2)]
[im 1/35]
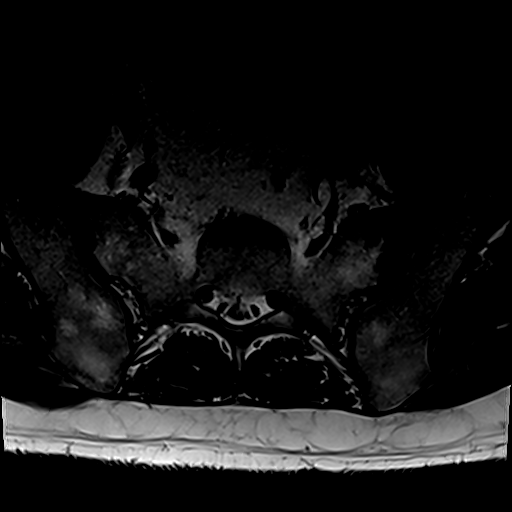
[im 5/35]
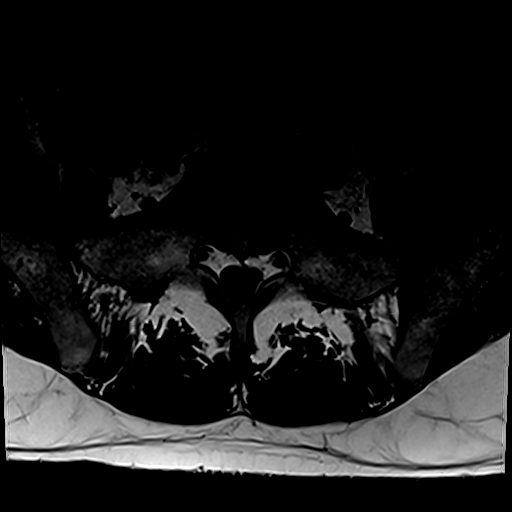
[im 10/35]
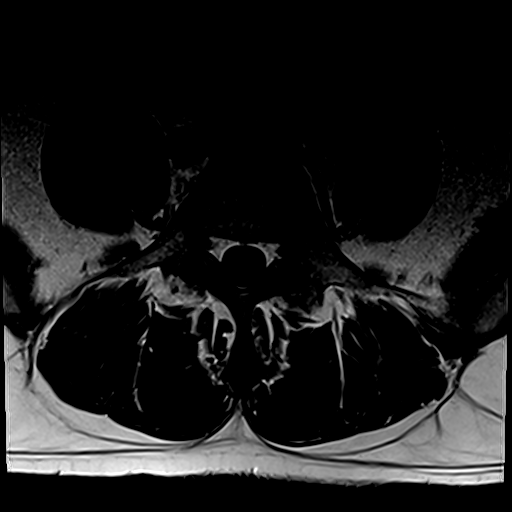
[im 15/35]
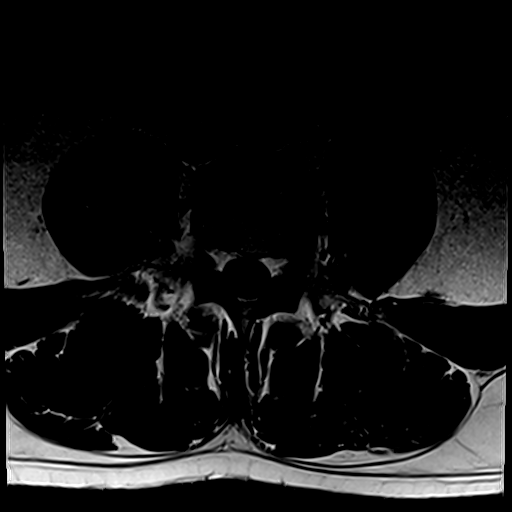
[im 18/35]
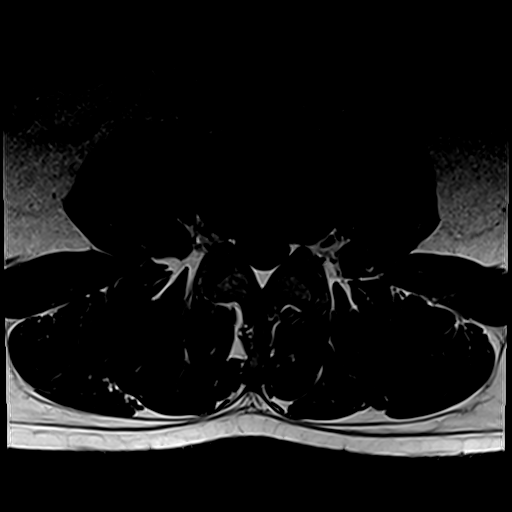
[im 20/35]
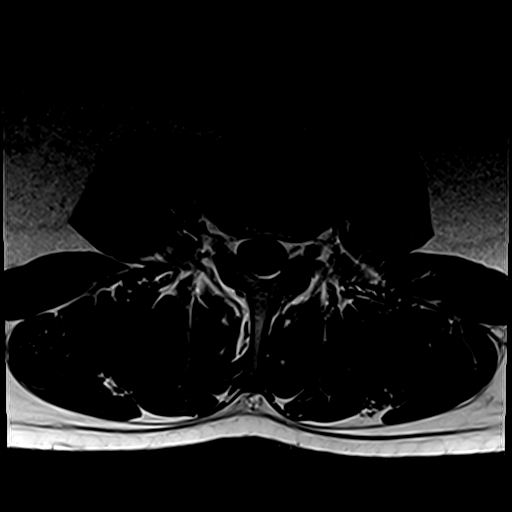
[im 25/35]
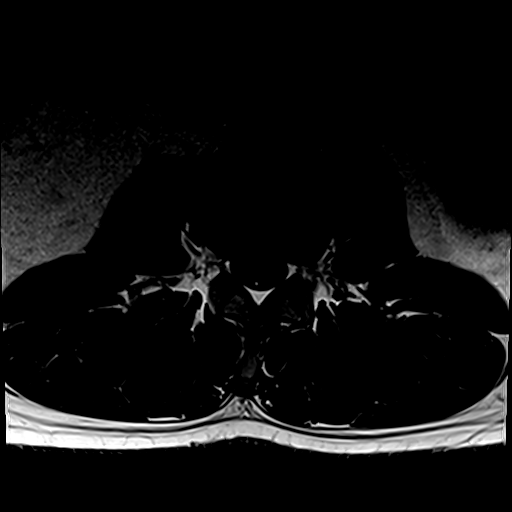
[im 30/35]
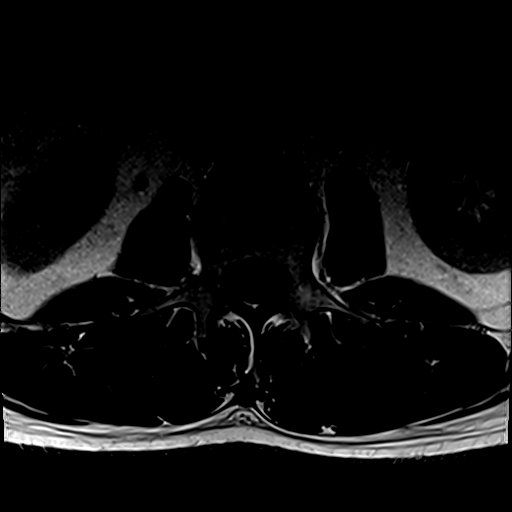
[im 35/35]
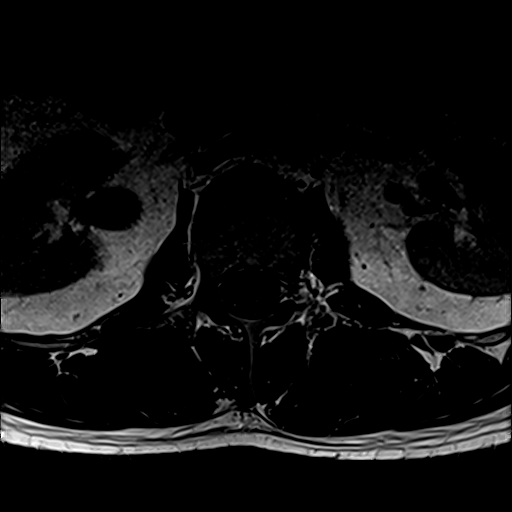

[30 of 48 positions shown; findings below may reference images not displayed]

FINDINGS: Segmentation: In correlating with the prior lumbar spine radiographs
of 10/14/2020, there are 5 lumbar vertebrae and the caudal most
well-formed intervertebral disc space is designated L5-S1.

Alignment: Straightening of the expected lumbar lordosis. No
significant spondylolisthesis.

Vertebrae: No lumbar vertebral compression fracture. Multilevel
degenerative endplate irregularity with small Schmorl nodes. Trace
degenerative endplate edema at L5-S1. Elsewhere, no significant
marrow edema or focal suspicious osseous lesion is identified. 4 mm
hemangioma within the S1 vertebral body.

Conus medullaris and cauda equina: Conus extends to the L1-L2 level.
No signal abnormality within the visualized distal spinal cord.

Paraspinal and other soft tissues: No abnormality identified within
included portions of the abdomen/retroperitoneum. Right extrarenal
pelvis incidentally noted. Paraspinal soft tissues unremarkable.

Disc levels:

Mild multilevel disc degeneration, greatest at L5-S1.

T12-L1: Imaged sagittally. No significant disc herniation or
stenosis.

L1-L2: Slight disc bulge. No significant spinal canal or foraminal
stenosis.

L2-L3: Slight disc bulge. No significant spinal canal or foraminal
stenosis.

L3-L4: Small disc bulge. Superimposed small left
foraminal/extraforaminal disc protrusion at site of posterior
annular fissure. Mild relative bilateral inferior neural foraminal
narrowing.

L4-L5: Slight disc bulge. Mild facet arthrosis and ligamentum flavum
hypertrophy. No significant spinal canal or foraminal stenosis.

L5-S1: Disc bulge. Superimposed very shallow broad-based
center/right subarticular disc protrusion at site of posterior
annular fissure. Mild endplate spurring/osteophytic ridging. The
disc protrusion contributes to multifactorial mild right greater
than left subarticular narrowing, with contact upon the descending
right S1 nerve root (series 8, image 29). Central canal patent. No
significant foraminal stenosis.
IMPRESSION: Lumbar spondylosis, as outlined and with findings most notably as
follows.

At L5-S1, there is mild disc degeneration with trace degenerative
endplate edema. Disc bulge. Superimposed very shallow broad-based
center/right subarticular disc protrusion at site of posterior
annular fissure. Mild endplate spurring/osteophytic ridging. The
disc protrusion contributes to multifactorial mild right greater
than left subarticular narrowing, with contact upon the descending
right S1 nerve root.

At L3-L4, there is a small disc bulge. Superimposed small left
foraminal/extraforaminal disc protrusion at site of posterior
annular fissure. Resultant mild relative bilateral inferior neural
foraminal narrowing.

No significant spinal canal or foraminal stenosis at the remaining
levels.

## 2023-08-01 ENCOUNTER — Telehealth: Payer: BC Managed Care – PPO | Admitting: Physician Assistant

## 2023-08-01 DIAGNOSIS — J208 Acute bronchitis due to other specified organisms: Secondary | ICD-10-CM | POA: Diagnosis not present

## 2023-08-01 DIAGNOSIS — B9689 Other specified bacterial agents as the cause of diseases classified elsewhere: Secondary | ICD-10-CM

## 2023-08-01 MED ORDER — AZITHROMYCIN 250 MG PO TABS: ORAL_TABLET | ORAL | 0 refills | Status: AC

## 2023-08-01 MED ORDER — BENZONATATE 100 MG PO CAPS
100.0000 mg | ORAL_CAPSULE | Freq: Three times a day (TID) | ORAL | 0 refills | Status: AC | PRN
Start: 2023-08-01 — End: ?

## 2023-08-01 NOTE — Progress Notes (Signed)

## 2023-08-02 ENCOUNTER — Ambulatory Visit: Payer: BC Managed Care – PPO | Admitting: Physician Assistant

## 2023-11-29 DIAGNOSIS — D485 Neoplasm of uncertain behavior of skin: Secondary | ICD-10-CM | POA: Diagnosis not present

## 2023-11-29 DIAGNOSIS — D2361 Other benign neoplasm of skin of right upper limb, including shoulder: Secondary | ICD-10-CM | POA: Diagnosis not present

## 2023-11-29 DIAGNOSIS — I781 Nevus, non-neoplastic: Secondary | ICD-10-CM | POA: Diagnosis not present

## 2024-01-16 DIAGNOSIS — M25561 Pain in right knee: Secondary | ICD-10-CM | POA: Diagnosis not present

## 2024-06-04 ENCOUNTER — Encounter: Admitting: Family Medicine

## 2024-06-04 VITALS — BP 115/75 | HR 65 | Temp 98.0°F | Ht 72.0 in | Wt 207.0 lb

## 2024-06-04 DIAGNOSIS — Z0000 Encounter for general adult medical examination without abnormal findings: Secondary | ICD-10-CM | POA: Diagnosis not present

## 2024-06-04 DIAGNOSIS — Z8249 Family history of ischemic heart disease and other diseases of the circulatory system: Secondary | ICD-10-CM | POA: Insufficient documentation

## 2024-06-04 DIAGNOSIS — Z13220 Encounter for screening for lipoid disorders: Secondary | ICD-10-CM | POA: Diagnosis not present

## 2024-06-04 DIAGNOSIS — Z13228 Encounter for screening for other metabolic disorders: Secondary | ICD-10-CM

## 2024-06-04 DIAGNOSIS — Z23 Encounter for immunization: Secondary | ICD-10-CM | POA: Diagnosis not present

## 2024-06-04 DIAGNOSIS — Z130 Encounter for screening for diseases of the blood and blood-forming organs and certain disorders involving the immune mechanism: Secondary | ICD-10-CM

## 2024-06-04 NOTE — Assessment & Plan Note (Signed)
 Overall doing well.  Labs today.  Flu vaccine given. Advised healthy diet and regular exercise.

## 2024-06-04 NOTE — Progress Notes (Signed)
 "  Subjective:  Patient ID: Paul Watson, male    DOB: [birth year]  Age: 38 y.o. MRN: [id number]  CC:   Chief Complaint  Patient presents with   Annual Exam    HPI:  38 year old male presents for annual exam.  He is a new patient to me.  He has not been examined in person in our clinic since 2022.    Normal blood pressure.  Occasional alcohol use.  Occasional vape use.  He states that he has loss of hair to the lateral lower extremities bilaterally.  His wife is concerned about this.  He endorses a family history of cardiovascular disease.  He is concerned about this as well.  He takes Celebrex occasionally for low back pain.  Patient needs routine labs.  He is amenable to flu vaccine today.  Patient Active Problem List   Diagnosis Date Noted   Family history of cardiovascular disease 06/04/2024   Annual physical exam 06/04/2024    Social Hx   Social History   Socioeconomic History   Marital status: Married    Spouse name: Not on file   Number of children: Not on file   Years of education: college    Highest education level: Bachelor's degree (e.g., BA, AB, BS)  Occupational History   Not on file  Tobacco Use   Smoking status: Never   Smokeless tobacco: Never  Substance and Sexual Activity   Alcohol use: Yes    Alcohol/week: 2.0 standard drinks of alcohol    Types: 1 Cans of beer, 1 Shots of liquor per week   Drug use: Not Currently    Frequency: 1.0 times per week    Types: Marijuana   Sexual activity: Yes    Birth control/protection: Surgical  Other Topics Concern   Not on file  Social History Narrative   Not on file   Social Drivers of Health   Tobacco Use: Low Risk (06/04/2024)   Patient History    Smoking Tobacco Use: Never    Smokeless Tobacco Use: Never    Passive Exposure: Not on file  Financial Resource Strain: Low Risk (06/04/2024)   Overall Financial Resource Strain (CARDIA)    Difficulty of Paying Living Expenses: Not very hard  Food  Insecurity: No Food Insecurity (06/04/2024)   Epic    Worried About Radiation Protection Practitioner of Food in the Last Year: Never true    Ran Out of Food in the Last Year: Never true  Transportation Needs: No Transportation Needs (06/04/2024)   Epic    Lack of Transportation (Medical): No    Lack of Transportation (Non-Medical): No  Physical Activity: Insufficiently Active (06/04/2024)   Exercise Vital Sign    Days of Exercise per Week: 1 day    Minutes of Exercise per Session: 20 min  Stress: No Stress Concern Present (06/04/2024)   [employer] of Occupational Health - Occupational Stress Questionnaire    Feeling of Stress: Not at all  Social Connections: Moderately Isolated (06/04/2024)   Social Connection and Isolation Panel    Frequency of Communication with Friends and Family: More than three times a week    Frequency of Social Gatherings with Friends and Family: Twice a week    Attends Religious Services: Never    Database Administrator or Organizations: No    Attends Banker Meetings: Not on file    Marital Status: Married  Depression (EYV7-0): Not on file  Alcohol Screen: Low Risk (06/04/2024)   Alcohol Screen  Last Alcohol Screening Score (AUDIT): 1  Housing: Low Risk (06/04/2024)   Epic    Unable to Pay for Housing in the Last Year: No    Number of Times Moved in the Last Year: 0    Homeless in the Last Year: No  Utilities: Not on file  Health Literacy: Not on file    Review of Systems Per HPI  Objective:  BP 115/75   Pulse 65   Temp 98 F (36.7 C)   Ht 6' (1.829 m)   Wt 207 lb (93.9 kg)   SpO2 98%   BMI 28.07 kg/m      06/04/2024    9:26 AM 03/06/2021    9:49 AM 01/02/2021    1:17 PM  BP/Weight  Systolic BP 115 135 147  Diastolic BP 75 86 93  Wt. (Lbs) 207 218   BMI 28.07 kg/m2 29.57 kg/m2     Physical Exam Vitals and nursing note reviewed.  Constitutional:      General: He is not in acute distress.    Appearance: Normal appearance.   HENT:     Head: Normocephalic and atraumatic.  Eyes:     General:        Right eye: No discharge.        Left eye: No discharge.     Conjunctiva/sclera: Conjunctivae normal.  Cardiovascular:     Rate and Rhythm: Normal rate and regular rhythm.     Pulses:          Dorsalis pedis pulses are 2+ on the right side and 2+ on the left side.       Posterior tibial pulses are 2+ on the right side and 2+ on the left side.  Pulmonary:     Effort: Pulmonary effort is normal.     Breath sounds: Normal breath sounds. No wheezing, rhonchi or rales.  Skin:    Comments: Lateral lower extremities below the knee with some mild hair loss.  He has hair on his feet and has good pulses.  This is not consistent with vascular disease.  Neurological:     Mental Status: He is alert.  Psychiatric:        Mood and Affect: Mood normal.        Behavior: Behavior normal.     Lab Results  Component Value Date   GLUCOSE 91 10/15/2017   CHOL 138 10/15/2017   TRIG 70 10/15/2017   HDL 40 10/15/2017   LDLCALC 84 10/15/2017   ALT 25 10/15/2017   AST 20 10/15/2017   NA 140 10/15/2017   K 4.4 10/15/2017   CL 102 10/15/2017   CREATININE 1.03 10/15/2017   BUN 11 10/15/2017   CO2 25 10/15/2017     Assessment & Plan:  Annual physical exam Assessment & Plan: Overall doing well.  Labs today.  Flu vaccine given. Advised healthy diet and regular exercise.    Family history of cardiovascular disease  Screening for deficiency anemia -     CBC  Screening for metabolic disorder -     CMP14+EGFR  Screening for lipid disorders -     Lipid panel  Need for vaccination -     Flu vaccine trivalent PF, 6mos and older(Flulaval,Afluria,Fluarix,Fluzone)    Follow-up:  Annually  Jacqulyn Ahle DO [facility] "

## 2024-06-04 NOTE — Patient Instructions (Signed)
 Labs today.  Healthy diet and regular exercise.  Follow up annually.  Take care  Dr. Bluford

## 2024-06-14 ENCOUNTER — Ambulatory Visit: Payer: Self-pay | Admitting: Family Medicine

## 2024-06-14 LAB — CBC
Hematocrit: 47 % (ref 37.5–51.0)
Hemoglobin: 15.5 g/dL (ref 13.0–17.7)
MCH: 30 pg (ref 26.6–33.0)
MCHC: 33 g/dL (ref 31.5–35.7)
MCV: 91 fL (ref 79–97)
Platelets: 192 x10E3/uL (ref 150–450)
RBC: 5.16 x10E6/uL (ref 4.14–5.80)
RDW: 12 % (ref 11.6–15.4)
WBC: 4.3 x10E3/uL (ref 3.4–10.8)

## 2024-06-14 LAB — CMP14+EGFR
ALT: 26 IU/L (ref 0–44)
AST: 21 IU/L (ref 0–40)
Albumin: 4.8 g/dL (ref 4.1–5.1)
Alkaline Phosphatase: 71 IU/L (ref 47–123)
BUN/Creatinine Ratio: 13 (ref 9–20)
BUN: 14 mg/dL (ref 6–20)
Bilirubin Total: 0.6 mg/dL (ref 0.0–1.2)
CO2: 27 mmol/L (ref 20–29)
Calcium: 9.4 mg/dL (ref 8.7–10.2)
Chloride: 100 mmol/L (ref 96–106)
Creatinine, Ser: 1.05 mg/dL (ref 0.76–1.27)
Globulin, Total: 2.5 g/dL (ref 1.5–4.5)
Glucose: 85 mg/dL (ref 70–99)
Potassium: 4.4 mmol/L (ref 3.5–5.2)
Sodium: 138 mmol/L (ref 134–144)
Total Protein: 7.3 g/dL (ref 6.0–8.5)
eGFR: 94 mL/min/1.73

## 2024-06-14 LAB — LIPID PANEL
Chol/HDL Ratio: 4.5 ratio (ref 0.0–5.0)
Cholesterol, Total: 158 mg/dL (ref 100–199)
HDL: 35 mg/dL — ABNORMAL LOW
LDL Chol Calc (NIH): 102 mg/dL — ABNORMAL HIGH (ref 0–99)
Triglycerides: 113 mg/dL (ref 0–149)
VLDL Cholesterol Cal: 21 mg/dL (ref 5–40)
